# Patient Record
Sex: Male | Born: 1970 | Race: White | Hispanic: No | Marital: Married | Smoking: Never smoker
Health system: Southern US, Community
[De-identification: ages and names within clinical notes are randomized; demographics above are authoritative.]

## PROBLEM LIST (undated history)

## (undated) DIAGNOSIS — K219 Gastro-esophageal reflux disease without esophagitis: Secondary | ICD-10-CM

## (undated) DIAGNOSIS — Q600 Renal agenesis, unilateral: Secondary | ICD-10-CM

## (undated) DIAGNOSIS — N390 Urinary tract infection, site not specified: Secondary | ICD-10-CM

## (undated) DIAGNOSIS — N189 Chronic kidney disease, unspecified: Secondary | ICD-10-CM

## (undated) DIAGNOSIS — T7840XA Allergy, unspecified, initial encounter: Secondary | ICD-10-CM

## (undated) DIAGNOSIS — K2270 Barrett's esophagus without dysplasia: Secondary | ICD-10-CM

## (undated) DIAGNOSIS — B019 Varicella without complication: Secondary | ICD-10-CM

## (undated) HISTORY — DX: Allergy, unspecified, initial encounter: T78.40XA

## (undated) HISTORY — DX: Barrett's esophagus without dysplasia: K22.70

## (undated) HISTORY — DX: Varicella without complication: B01.9

## (undated) HISTORY — DX: Gastro-esophageal reflux disease without esophagitis: K21.9

## (undated) HISTORY — DX: Renal agenesis, unilateral: Q60.0

## (undated) HISTORY — DX: Urinary tract infection, site not specified: N39.0

## (undated) HISTORY — PX: FRENULECTOMY, LINGUAL: SHX1681

## (undated) HISTORY — DX: Chronic kidney disease, unspecified: N18.9

## (undated) HISTORY — PX: VASECTOMY: SHX75

---
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
## 2013-04-07 ENCOUNTER — Encounter: Payer: Self-pay | Admitting: Family Medicine

## 2013-04-07 ENCOUNTER — Ambulatory Visit (INDEPENDENT_AMBULATORY_CARE_PROVIDER_SITE_OTHER): Payer: No Typology Code available for payment source | Admitting: Family Medicine

## 2013-04-07 VITALS — BP 128/76 | HR 74 | Temp 98.2°F | Ht 72.0 in | Wt 213.0 lb

## 2013-04-07 DIAGNOSIS — H579 Unspecified disorder of eye and adnexa: Secondary | ICD-10-CM

## 2013-04-07 DIAGNOSIS — Z3009 Encounter for other general counseling and advice on contraception: Secondary | ICD-10-CM

## 2013-04-07 DIAGNOSIS — H53149 Visual discomfort, unspecified: Secondary | ICD-10-CM

## 2013-04-07 DIAGNOSIS — K644 Residual hemorrhoidal skin tags: Secondary | ICD-10-CM

## 2013-04-07 NOTE — Progress Notes (Signed)
Subjective:    Patient ID: Lucas Murray, male    DOB: Jan 08, 1971, 42 y.o.   MRN: [id number]  HPI Patient here to establish care. Generally very healthy. He has no real chronic medical problems. He has occasional GERD symptoms but has made some positive dietary changes over the past couple months and GERD has essentially resolved He had one episode only of UTI several years ago. Previous wisdom teeth extraction and frenulectomy age 32.  Patient is married has 2 children. Nonsmoker. 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 Only occasional alcohol use  He is requesting referral for vasectomy. He and his wife are sure they wish to have no further children. He had previous external hemorrhoids. He denies any recent bleeding or associated pain. Previously had some constipation issues but those have improved with recent dietary changes  Recently his had some left eye symptoms of what he initially described as" blurred vision". He went to ophthalmologist 2 months ago and they suggested reducing caffeine intake. He reportedly had a normal dilated eye exam then. He describes "jumping sensation" of left eye which is very transient -usually only lasting about 2-3 seconds with about 10-20 episodes per day. During these episodes, he has difficulty somewhat with focusing b/o the sensation of eye movement. No headaches. No fasciculations No diplopia. No recent appetite or weight changes.  Past Medical History  Diagnosis Date  . Chicken pox   . GERD (gastroesophageal reflux disease)   . Allergy   . Urinary tract infection    Past Surgical History  Procedure Laterality Date  . Wisdom tooth extraction  1990  . Frenulectomy, lingual N/A     age 32    reports that he has never smoked. He does not have any smokeless tobacco history on file. He reports that  drinks alcohol. He reports that he does not use illicit drugs. family history includes Arthritis in his maternal grandmother and mother;  Cancer in his maternal grandfather; Heart disease in his maternal grandfather; Hyperlipidemia in his mother; Hypertension in his maternal grandfather and mother. Allergies  Allergen Reactions  . E-Mycin [Erythromycin]   . Penicillins       Review of Systems  Constitutional: Negative for fever, appetite change and unexpected weight change.  Eyes: Positive for visual disturbance. Negative for photophobia, pain, discharge, redness and itching.  Respiratory: Negative for shortness of breath.   Cardiovascular: Negative for chest pain.  Gastrointestinal: Negative for nausea, vomiting, abdominal pain and constipation.  Genitourinary: Negative for dysuria.  Neurological: Negative for dizziness, seizures, weakness and headaches.       Objective:   Physical Exam  Constitutional: He is oriented to person, place, and time. He appears well-developed and well-nourished.  HENT:  Mouth/Throat: Oropharynx is clear and moist.  Eyes: EOM are normal. Pupils are equal, round, and reactive to light.  Pupils equal round reactive to light. Extraocular movements all normal. We had some difficulty seeing fundi bilaterally secondary to some glare  Cardiovascular: Normal rate and regular rhythm.   No murmur heard. Pulmonary/Chest: Effort normal and breath sounds normal. No respiratory distress. He has no wheezes. He has no rales.  Neurological: He is alert and oriented to person, place, and time. No cranial nerve deficit. Coordination normal.          Assessment & Plan:  #1 request for vasectomy. We'll set up a referral to urology #2 transient left eye symptoms above. Extraocular movements and basic eye exam normal. He is not describing typical fasciculations. Referral  back to ophthalmology. #3 external hemorrhoids. Currently stable. Continue high fiber diet

## 2013-04-07 NOTE — Patient Instructions (Addendum)
Follow up with ophthalmologist regarding left eye symptoms.   We will call you regarding urology appointment for vasectomy.

## 2014-12-30 ENCOUNTER — Other Ambulatory Visit (INDEPENDENT_AMBULATORY_CARE_PROVIDER_SITE_OTHER): Payer: PRIVATE HEALTH INSURANCE

## 2014-12-30 DIAGNOSIS — Z0000 Encounter for general adult medical examination without abnormal findings: Secondary | ICD-10-CM | POA: Diagnosis not present

## 2014-12-30 LAB — CBC WITH DIFFERENTIAL/PLATELET
Basophils Absolute: 0.1 10*3/uL (ref 0.0–0.1)
Basophils Relative: 1 % (ref 0–1)
EOS ABS: 0.2 10*3/uL (ref 0.0–0.7)
EOS PCT: 3 % (ref 0–5)
HCT: 42.6 % (ref 39.0–52.0)
Hemoglobin: 15.4 g/dL (ref 13.0–17.0)
LYMPHS ABS: 1.5 10*3/uL (ref 0.7–4.0)
Lymphocytes Relative: 24 % (ref 12–46)
MCH: 31 pg (ref 26.0–34.0)
MCHC: 36.2 g/dL — ABNORMAL HIGH (ref 30.0–36.0)
MCV: 85.9 fL (ref 78.0–100.0)
MONO ABS: 0.5 10*3/uL (ref 0.1–1.0)
MONOS PCT: 8 % (ref 3–12)
MPV: 10.1 fL (ref 8.6–12.4)
Neutro Abs: 4 10*3/uL (ref 1.7–7.7)
Neutrophils Relative %: 64 % (ref 43–77)
PLATELETS: 198 10*3/uL (ref 150–400)
RBC: 4.96 MIL/uL (ref 4.22–5.81)
RDW: 12.8 % (ref 11.5–15.5)
WBC: 6.2 10*3/uL (ref 4.0–10.5)

## 2014-12-30 NOTE — Addendum Note (Signed)
Addended by: Elmer Picker on: 12/30/2014 09:06 AM   Modules accepted: Orders

## 2014-12-31 LAB — LIPID PANEL
CHOL/HDL RATIO: 5.6 ratio
Cholesterol: 146 mg/dL (ref 0–200)
HDL: 26 mg/dL — AB (ref >=40)
LDL CALC: 89 mg/dL (ref 0–99)
TRIGLYCERIDES: 153 mg/dL — AB (ref <150)
VLDL: 31 mg/dL (ref 0–40)

## 2014-12-31 LAB — HEPATIC FUNCTION PANEL
ALBUMIN: 4.2 g/dL (ref 3.5–5.2)
ALK PHOS: 55 U/L (ref 39–117)
ALT: 28 U/L (ref 0–53)
AST: 21 U/L (ref 0–37)
BILIRUBIN TOTAL: 1.1 mg/dL (ref 0.2–1.2)
Bilirubin, Direct: 0.2 mg/dL (ref 0.0–0.3)
Indirect Bilirubin: 0.9 mg/dL (ref 0.2–1.2)
Total Protein: 7.1 g/dL (ref 6.0–8.3)

## 2014-12-31 LAB — BASIC METABOLIC PANEL
BUN: 12 mg/dL (ref 6–23)
CO2: 26 mEq/L (ref 19–32)
Calcium: 9.5 mg/dL (ref 8.4–10.5)
Chloride: 102 mEq/L (ref 96–112)
Creat: 1.15 mg/dL (ref 0.50–1.35)
GLUCOSE: 84 mg/dL (ref 70–99)
POTASSIUM: 4.2 meq/L (ref 3.5–5.3)
SODIUM: 137 meq/L (ref 135–145)

## 2014-12-31 LAB — TSH: TSH: 0.809 u[IU]/mL (ref 0.350–4.500)

## 2015-01-04 ENCOUNTER — Ambulatory Visit (INDEPENDENT_AMBULATORY_CARE_PROVIDER_SITE_OTHER): Payer: PRIVATE HEALTH INSURANCE | Admitting: Family Medicine

## 2015-01-04 ENCOUNTER — Encounter: Payer: Self-pay | Admitting: Family Medicine

## 2015-01-04 VITALS — BP 128/80 | HR 86 | Temp 97.9°F | Ht 71.65 in | Wt 216.0 lb

## 2015-01-04 DIAGNOSIS — Z23 Encounter for immunization: Secondary | ICD-10-CM | POA: Diagnosis not present

## 2015-01-04 DIAGNOSIS — Z0000 Encounter for general adult medical examination without abnormal findings: Secondary | ICD-10-CM | POA: Diagnosis not present

## 2015-01-04 NOTE — Patient Instructions (Signed)

## 2015-01-04 NOTE — Progress Notes (Signed)
   Subjective:    Patient ID: Lucas Murray, male    DOB: 03-19-1971, 44 y.o.   MRN: [id number]  HPI   Patient here for complete physical. He had vasectomy within the past year. 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 He has no chronic medical problems. No history of chronic kidney disease. Takes no regular medications. Nonsmoker. No consistent exercise. Is married with 2 children ages 34 and 69. Last tetanus over 10 years ago  Recent onset of cough about 2 weeks ago. Productive of mostly clear phlegm. No fevers. No dyspnea. No wheezing. No obvious GERD symptoms.  Past Medical History  Diagnosis Date  . Chicken pox   . GERD (gastroesophageal reflux disease)   . Allergy   . Urinary tract infection    Past Surgical History  Procedure Laterality Date  . Wisdom tooth extraction  1990  . Frenulectomy, lingual N/A     age 86    reports that he has never smoked. He does not have any smokeless tobacco history on file. He reports that he drinks alcohol. He reports that he does not use illicit drugs. family history includes Arthritis in his maternal grandmother and mother; Cancer in his maternal grandfather; Heart disease in his maternal grandfather; Hyperlipidemia in his mother; Hypertension in his maternal grandfather and mother. Allergies  Allergen Reactions  . E-Mycin [Erythromycin]   . Penicillins       Review of Systems  Constitutional: Negative for fever, activity change, appetite change and fatigue.  HENT: Negative for congestion, ear pain and trouble swallowing.   Eyes: Negative for pain and visual disturbance.  Respiratory: Negative for cough, shortness of breath and wheezing.   Cardiovascular: Negative for chest pain and palpitations.  Gastrointestinal: Negative for nausea, vomiting, abdominal pain, diarrhea, constipation, blood in stool, abdominal distention and  rectal pain.  Genitourinary: Negative for dysuria, hematuria and testicular pain.  Musculoskeletal: Negative for joint swelling and arthralgias.  Skin: Negative for rash.  Neurological: Negative for dizziness, syncope and headaches.  Hematological: Negative for adenopathy.  Psychiatric/Behavioral: Negative for confusion and dysphoric mood.       Objective:   Physical Exam  Constitutional: He is oriented to person, place, and time. He appears well-developed and well-nourished. No distress.  HENT:  Head: Normocephalic and atraumatic.  Right Ear: External ear normal.  Left Ear: External ear normal.  Mouth/Throat: Oropharynx is clear and moist.  Eyes: Conjunctivae and EOM are normal. Pupils are equal, round, and reactive to light.  Neck: Normal range of motion. Neck supple. No thyromegaly present.  Cardiovascular: Normal rate, regular rhythm and normal heart sounds.   No murmur heard. Pulmonary/Chest: No respiratory distress. He has no wheezes. He has no rales.  Abdominal: Soft. Bowel sounds are normal. He exhibits no distension and no mass. There is no tenderness. There is no rebound and no guarding.  Musculoskeletal: He exhibits no edema.  Lymphadenopathy:    He has no cervical adenopathy.  Neurological: He is alert and oriented to person, place, and time. He displays normal reflexes. No cranial nerve deficit.  Skin: No rash noted.  Psychiatric: He has a normal mood and affect.          Assessment & Plan:  Complete physical. Labs reviewed. He has low HDL and minimally elevated triglycerides. We've recommended more consistent exercise. Lose some weight.  Tetanus booster given. Handout on hypertriglyceridemia given

## 2015-01-04 NOTE — Progress Notes (Signed)
Pre visit review using our clinic review tool, if applicable. No additional management support is needed unless otherwise documented below in the visit note. 

## 2015-10-12 ENCOUNTER — Ambulatory Visit (INDEPENDENT_AMBULATORY_CARE_PROVIDER_SITE_OTHER): Payer: No Typology Code available for payment source | Admitting: Family Medicine

## 2015-10-12 ENCOUNTER — Encounter: Payer: Self-pay | Admitting: Family Medicine

## 2015-10-12 VITALS — BP 120/90 | HR 81 | Temp 98.2°F | Wt 216.8 lb

## 2015-10-12 DIAGNOSIS — B349 Viral infection, unspecified: Secondary | ICD-10-CM | POA: Diagnosis not present

## 2015-10-12 NOTE — Progress Notes (Signed)
Pre visit review using our clinic review tool, if applicable. No additional management support is needed unless otherwise documented below in the visit note. 

## 2015-10-12 NOTE — Patient Instructions (Signed)

## 2015-10-12 NOTE — Progress Notes (Signed)
   Subjective:    Patient ID: Lucas Murray, male    DOB: Dec 16, 1970, 45 y.o.   MRN: [id number]  HPI Acute visit. Patient developed onset of cough and probable fever last Friday. Did not take his temperature over the weekend but Monday had fever 101. He also noticed severe body aches. Denied any sore throat, nausea, vomiting. No fever since yesterday. Cough is been relatively mild and relieved with Mucinex DM. He had some decreased appetite. Did not receive flu vaccine this fall  Past Medical History  Diagnosis Date  . Chicken pox   . GERD (gastroesophageal reflux disease)   . Allergy   . Urinary tract infection    Past Surgical History  Procedure Laterality Date  . Wisdom tooth extraction  1990  . Frenulectomy, lingual N/A     age 70    reports that he has never smoked. He does not have any smokeless tobacco history on file. He reports that he drinks alcohol. He reports that he does not use illicit drugs. family history includes Arthritis in his maternal grandmother and mother; Cancer in his maternal grandfather; Heart disease in his maternal grandfather; Hyperlipidemia in his mother; Hypertension in his maternal grandfather and mother. Allergies  Allergen Reactions  . E-Mycin [Erythromycin]   . Penicillins       Review of Systems  Constitutional: Positive for fatigue. Negative for chills.  HENT: Positive for congestion. Negative for sore throat.   Respiratory: Positive for cough.        Objective:   Physical Exam  Constitutional: He appears well-developed and well-nourished.  HENT:  Right Ear: External ear normal.  Left Ear: External ear normal.  Mouth/Throat: Oropharynx is clear and moist.  Neck: Neck supple.  Cardiovascular: Normal rate and regular rhythm.   Pulmonary/Chest: Effort normal and breath sounds normal. No respiratory distress. He has no wheezes. He has no rales.  Lymphadenopathy:    He has no cervical adenopathy.  Skin: No rash noted.            Assessment & Plan:  Viral syndrome. Suspect he has had influenza. Given duration and the fact that his fevers resolved will not do flu testing at this time. Supportive care with continued increased fluids and Tylenol or Advil for body aches. Follow-up for any recurrent fever or other difficulties

## 2017-06-29 ENCOUNTER — Encounter (HOSPITAL_COMMUNITY): Payer: Self-pay | Admitting: Emergency Medicine

## 2017-06-29 ENCOUNTER — Inpatient Hospital Stay (HOSPITAL_COMMUNITY)
Admission: EM | Admit: 2017-06-29 | Discharge: 2017-07-08 | DRG: 330 | Disposition: A | Discharge status: Home / Self Care | Payer: No Typology Code available for payment source | Attending: Surgery | Admitting: Surgery

## 2017-06-29 ENCOUNTER — Inpatient Hospital Stay (HOSPITAL_COMMUNITY): Payer: No Typology Code available for payment source

## 2017-06-29 ENCOUNTER — Emergency Department (HOSPITAL_COMMUNITY): Payer: No Typology Code available for payment source

## 2017-06-29 DIAGNOSIS — Z8261 Family history of arthritis: Secondary | ICD-10-CM

## 2017-06-29 DIAGNOSIS — Y832 Surgical operation with anastomosis, bypass or graft as the cause of abnormal reaction of the patient, or of later complication, without mention of misadventure at the time of the procedure: Secondary | ICD-10-CM | POA: Diagnosis present

## 2017-06-29 DIAGNOSIS — K9189 Other postprocedural complications and disorders of digestive system: Secondary | ICD-10-CM | POA: Diagnosis present

## 2017-06-29 DIAGNOSIS — Z8249 Family history of ischemic heart disease and other diseases of the circulatory system: Secondary | ICD-10-CM

## 2017-06-29 DIAGNOSIS — K567 Ileus, unspecified: Secondary | ICD-10-CM | POA: Diagnosis present

## 2017-06-29 DIAGNOSIS — Z8349 Family history of other endocrine, nutritional and metabolic diseases: Secondary | ICD-10-CM | POA: Diagnosis not present

## 2017-06-29 DIAGNOSIS — K56609 Unspecified intestinal obstruction, unspecified as to partial versus complete obstruction: Secondary | ICD-10-CM | POA: Diagnosis present

## 2017-06-29 DIAGNOSIS — K56699 Other intestinal obstruction unspecified as to partial versus complete obstruction: Secondary | ICD-10-CM | POA: Diagnosis present

## 2017-06-29 DIAGNOSIS — R112 Nausea with vomiting, unspecified: Secondary | ICD-10-CM

## 2017-06-29 DIAGNOSIS — Z0189 Encounter for other specified special examinations: Secondary | ICD-10-CM

## 2017-06-29 DIAGNOSIS — R066 Hiccough: Secondary | ICD-10-CM

## 2017-06-29 LAB — COMPREHENSIVE METABOLIC PANEL
ALK PHOS: 59 U/L (ref 38–126)
ALT: 40 U/L (ref 17–63)
ANION GAP: 12 (ref 5–15)
AST: 41 U/L (ref 15–41)
Albumin: 5 g/dL (ref 3.5–5.0)
BILIRUBIN TOTAL: 1.6 mg/dL — AB (ref 0.3–1.2)
BUN: 14 mg/dL (ref 6–20)
CALCIUM: 10.4 mg/dL — AB (ref 8.9–10.3)
CO2: 21 mmol/L — ABNORMAL LOW (ref 22–32)
Chloride: 103 mmol/L (ref 101–111)
Creatinine, Ser: 1.16 mg/dL (ref 0.61–1.24)
GFR calc Af Amer: >60 mL/min (ref >60)
Glucose, Bld: 155 mg/dL — ABNORMAL HIGH (ref 65–99)
POTASSIUM: 4.9 mmol/L (ref 3.5–5.1)
Sodium: 136 mmol/L (ref 135–145)
TOTAL PROTEIN: 8.1 g/dL (ref 6.5–8.1)

## 2017-06-29 LAB — CBC
HEMATOCRIT: 47.9 % (ref 39.0–52.0)
Hemoglobin: 17.3 g/dL — ABNORMAL HIGH (ref 13.0–17.0)
MCH: 31.7 pg (ref 26.0–34.0)
MCHC: 36.1 g/dL — AB (ref 30.0–36.0)
MCV: 87.7 fL (ref 78.0–100.0)
PLATELETS: 260 10*3/uL (ref 150–400)
RBC: 5.46 MIL/uL (ref 4.22–5.81)
RDW: 12.4 % (ref 11.5–15.5)
WBC: 15.7 10*3/uL — AB (ref 4.0–10.5)

## 2017-06-29 LAB — URINALYSIS, ROUTINE W REFLEX MICROSCOPIC
BACTERIA UA: NONE SEEN
Bilirubin Urine: NEGATIVE
Glucose, UA: NEGATIVE mg/dL
HGB URINE DIPSTICK: NEGATIVE
Ketones, ur: 20 mg/dL — AB
Leukocytes, UA: NEGATIVE
Nitrite: NEGATIVE
PROTEIN: 30 mg/dL — AB
SQUAMOUS EPITHELIAL / LPF: NONE SEEN
Specific Gravity, Urine: 1.028 (ref 1.005–1.030)
pH: 6 (ref 5.0–8.0)

## 2017-06-29 LAB — I-STAT CG4 LACTIC ACID, ED
LACTIC ACID, VENOUS: 1.99 mmol/L — AB (ref 0.5–1.9)
LACTIC ACID, VENOUS: 1.3 mmol/L (ref 0.5–1.9)

## 2017-06-29 LAB — LIPASE, BLOOD: Lipase: 33 U/L (ref 11–51)

## 2017-06-29 MED ORDER — LIDOCAINE HCL 2 % EX GEL
1.0000 "application " | Freq: Once | CUTANEOUS | Status: DC | PRN
  Filled 2017-06-29: qty 5

## 2017-06-29 MED ORDER — DIPHENHYDRAMINE HCL 25 MG PO CAPS: 25.0000 mg | ORAL_CAPSULE | Freq: Four times a day (QID) | ORAL | Status: DC | PRN

## 2017-06-29 MED ORDER — IOPAMIDOL (ISOVUE-300) INJECTION 61%
INTRAVENOUS | Status: AC
  Administered 2017-06-29: 80 mL via INTRAVENOUS
  Filled 2017-06-29: qty 100

## 2017-06-29 MED ORDER — ONDANSETRON 4 MG PO TBDP: 4.0000 mg | ORAL_TABLET | Freq: Four times a day (QID) | ORAL | Status: DC | PRN

## 2017-06-29 MED ORDER — DIATRIZOATE MEGLUMINE & SODIUM 66-10 % PO SOLN
90.0000 mL | Freq: Once | ORAL | Status: AC
  Administered 2017-06-30: 90 mL via NASOGASTRIC
  Filled 2017-06-29: qty 90

## 2017-06-29 MED ORDER — ONDANSETRON HCL 4 MG/2ML IJ SOLN
4.0000 mg | Freq: Once | INTRAMUSCULAR | Status: AC
  Administered 2017-06-29: 4 mg via INTRAVENOUS
  Filled 2017-06-29: qty 2

## 2017-06-29 MED ORDER — POTASSIUM CHLORIDE IN NACL 20-0.9 MEQ/L-% IV SOLN
INTRAVENOUS | Status: DC
  Administered 2017-06-29 – 2017-07-01 (×4): via INTRAVENOUS
  Filled 2017-06-29 (×5): qty 1000

## 2017-06-29 MED ORDER — DIPHENHYDRAMINE HCL 50 MG/ML IJ SOLN: 25.0000 mg | Freq: Four times a day (QID) | INTRAMUSCULAR | Status: DC | PRN

## 2017-06-29 MED ORDER — ONDANSETRON HCL 4 MG/2ML IJ SOLN
4.0000 mg | Freq: Once | INTRAMUSCULAR | Status: AC | PRN
  Administered 2017-06-29: 4 mg via INTRAVENOUS
  Filled 2017-06-29: qty 2

## 2017-06-29 MED ORDER — METOCLOPRAMIDE HCL 5 MG/ML IJ SOLN
10.0000 mg | Freq: Once | INTRAMUSCULAR | Status: AC
  Administered 2017-06-29: 10 mg via INTRAVENOUS
  Filled 2017-06-29: qty 2

## 2017-06-29 MED ORDER — IOPAMIDOL (ISOVUE-300) INJECTION 61%: 30.0000 mL | Freq: Once | INTRAVENOUS | Status: DC

## 2017-06-29 MED ORDER — ONDANSETRON HCL 4 MG/2ML IJ SOLN
4.0000 mg | Freq: Four times a day (QID) | INTRAMUSCULAR | Status: DC | PRN
  Administered 2017-07-01: 4 mg via INTRAVENOUS
  Filled 2017-06-29: qty 2

## 2017-06-29 MED ORDER — ZOLPIDEM TARTRATE 5 MG PO TABS: 5.0000 mg | ORAL_TABLET | Freq: Every evening | ORAL | Status: DC | PRN

## 2017-06-29 MED ORDER — IOPAMIDOL (ISOVUE-300) INJECTION 61%
INTRAVENOUS | Status: AC
  Administered 2017-06-29: 30 mL via ORAL
  Filled 2017-06-29: qty 30

## 2017-06-29 MED ORDER — PANTOPRAZOLE SODIUM 40 MG IV SOLR
40.0000 mg | Freq: Every day | INTRAVENOUS | Status: DC
  Administered 2017-06-29 – 2017-07-07 (×9): 40 mg via INTRAVENOUS
  Filled 2017-06-29 (×9): qty 40

## 2017-06-29 MED ORDER — METOCLOPRAMIDE HCL 5 MG/ML IJ SOLN
10.0000 mg | INTRAMUSCULAR | Status: AC
  Administered 2017-06-29: 10 mg via INTRAVENOUS
  Filled 2017-06-29: qty 2

## 2017-06-29 MED ORDER — MORPHINE SULFATE (PF) 2 MG/ML IV SOLN
2.0000 mg | INTRAVENOUS | Status: DC | PRN
  Administered 2017-06-29 – 2017-07-01 (×3): 2 mg via INTRAVENOUS
  Filled 2017-06-29 (×3): qty 1

## 2017-06-29 MED ORDER — FENTANYL CITRATE (PF) 100 MCG/2ML IJ SOLN
50.0000 ug | Freq: Once | INTRAMUSCULAR | Status: AC
  Administered 2017-06-29: 50 ug via INTRAVENOUS
  Filled 2017-06-29: qty 2

## 2017-06-29 MED ORDER — SODIUM CHLORIDE 0.9 % IV BOLUS (SEPSIS)
1000.0000 mL | Freq: Once | INTRAVENOUS | Status: AC
  Administered 2017-06-29: 1000 mL via INTRAVENOUS

## 2017-06-29 MED ORDER — PROCHLORPERAZINE EDISYLATE 5 MG/ML IJ SOLN
5.0000 mg | Freq: Four times a day (QID) | INTRAMUSCULAR | Status: DC | PRN
  Administered 2017-06-30: 10 mg via INTRAVENOUS
  Filled 2017-06-29: qty 2

## 2017-06-29 MED ORDER — SODIUM CHLORIDE 0.9 % IV BOLUS (SEPSIS)
1000.0000 mL | Freq: Once | INTRAVENOUS | Status: AC
Start: 2017-06-29 — End: 2017-06-29
  Administered 2017-06-29: 1000 mL via INTRAVENOUS

## 2017-06-29 MED ORDER — ENOXAPARIN SODIUM 40 MG/0.4ML ~~LOC~~ SOLN
40.0000 mg | Freq: Every day | SUBCUTANEOUS | Status: DC
  Administered 2017-06-30 – 2017-07-07 (×8): 40 mg via SUBCUTANEOUS
  Filled 2017-06-29 (×8): qty 0.4

## 2017-06-29 MED ORDER — PROCHLORPERAZINE MALEATE 10 MG PO TABS: 10.0000 mg | ORAL_TABLET | Freq: Four times a day (QID) | ORAL | Status: DC | PRN

## 2017-06-29 NOTE — ED Notes (Signed)
Only able to get enough blood for light green top tube.

## 2017-06-29 NOTE — ED Notes (Signed)
Pt transported to CT ?

## 2017-06-29 NOTE — H&P (Signed)
Lucas Murray is an 46 y.o. male.   Chief Complaint: Abdominal pain, nausea, vomiting HPI: This is a healthy 46 yo male with no previous surgical history who presents with 24 hours of mid-periumbilical abdominal pain, distention, and multiple episodes of nausea and vomiting.  He had a bowel movements yesterday about lunchtime which was normal in consistency, perhaps darker in color.  He still feels distended, despite multiple episodes of emesis.  He presented to the ED for evaluation and was found to have an SBO, despite no previous surgeries.  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  Past Medical History:  Diagnosis Date  . Allergy   . Chicken pox   . GERD (gastroesophageal reflux disease)   . Urinary tract infection     Past Surgical History:  Procedure Laterality Date  . FRENULECTOMY, LINGUAL N/A    age 20  . VASECTOMY    . WISDOM TOOTH EXTRACTION  1990    Family History  Problem Relation Age of Onset  . Arthritis Mother   . Hyperlipidemia Mother   . Hypertension Mother   . Arthritis Maternal Grandmother   . Heart disease Maternal Grandfather   . Cancer Maternal Grandfather        lung  . Hypertension Maternal Grandfather    Social History:  reports that  has never smoked. he has never used smokeless tobacco. He reports that he drinks alcohol. He reports that he does not use drugs.  Allergies:  Allergies  Allergen Reactions  . E-Mycin [Erythromycin] Anaphylaxis  . Penicillins     Has patient had a PCN reaction causing immediate rash, facial/tongue/throat swelling, SOB or lightheadedness with hypotension: No Has patient had a PCN reaction causing severe rash involving mucus membranes or skin necrosis: No Has patient had a PCN reaction that required hospitalization: No Has patient had a PCN reaction occurring within the last 10 years: No If all of the above answers are "NO", then may proceed with Cephalosporin use.    Meds:  none  Results for orders placed or performed during  the hospital encounter of 06/29/17 (from the past 48 hour(s))  Lipase, blood     Status: None   Collection Time: 06/29/17  2:56 PM  Result Value Ref Range   Lipase 33 11 - 51 U/L  Comprehensive metabolic panel     Status: Abnormal   Collection Time: 06/29/17  2:56 PM  Result Value Ref Range   Sodium 136 135 - 145 mmol/L   Potassium 4.9 3.5 - 5.1 mmol/L   Chloride 103 101 - 111 mmol/L   CO2 21 (L) 22 - 32 mmol/L   Glucose, Bld 155 (H) 65 - 99 mg/dL   BUN 14 6 - 20 mg/dL   Creatinine, Ser 1.16 0.61 - 1.24 mg/dL   Calcium 10.4 (H) 8.9 - 10.3 mg/dL   Total Protein 8.1 6.5 - 8.1 g/dL   Albumin 5.0 3.5 - 5.0 g/dL   AST 41 15 - 41 U/L   ALT 40 17 - 63 U/L   Alkaline Phosphatase 59 38 - 126 U/L   Total Bilirubin 1.6 (H) 0.3 - 1.2 mg/dL   GFR calc non Af Amer >60 >60 mL/min   GFR calc Af Amer >60 >60 mL/min    Comment: (NOTE) The eGFR has been calculated using the CKD EPI equation. This calculation has not been validated in all clinical situations. eGFR's persistently <60 mL/min signify possible Chronic Kidney Disease.    Anion gap 12 5 - 15  CBC     Status: Abnormal   Collection Time: 06/29/17  2:56 PM  Result Value Ref Range   WBC 15.7 (H) 4.0 - 10.5 K/uL   RBC 5.46 4.22 - 5.81 MIL/uL   Hemoglobin 17.3 (H) 13.0 - 17.0 g/dL   HCT 47.9 39.0 - 52.0 %   MCV 87.7 78.0 - 100.0 fL   MCH 31.7 26.0 - 34.0 pg   MCHC 36.1 (H) 30.0 - 36.0 g/dL   RDW 12.4 11.5 - 15.5 %   Platelets 260 150 - 400 K/uL  Urinalysis, Routine w reflex microscopic     Status: Abnormal   Collection Time: 06/29/17  3:17 PM  Result Value Ref Range   Color, Urine YELLOW YELLOW   APPearance CLEAR CLEAR   Specific Gravity, Urine 1.028 1.005 - 1.030   pH 6.0 5.0 - 8.0   Glucose, UA NEGATIVE NEGATIVE mg/dL   Hgb urine dipstick NEGATIVE NEGATIVE   Bilirubin Urine NEGATIVE NEGATIVE   Ketones, ur 20 (A) NEGATIVE mg/dL   Protein, ur 30 (A) NEGATIVE mg/dL   Nitrite NEGATIVE NEGATIVE   Leukocytes, UA NEGATIVE NEGATIVE    RBC / HPF 0-5 0 - 5 RBC/hpf   WBC, UA 0-5 0 - 5 WBC/hpf   Bacteria, UA NONE SEEN NONE SEEN   Squamous Epithelial / LPF NONE SEEN NONE SEEN   Mucus PRESENT   I-Stat CG4 Lactic Acid, ED     Status: Abnormal   Collection Time: 06/29/17  3:32 PM  Result Value Ref Range   Lactic Acid, Venous 1.99 (H) 0.5 - 1.9 mmol/L  I-Stat CG4 Lactic Acid, ED     Status: None   Collection Time: 06/29/17  5:31 PM  Result Value Ref Range   Lactic Acid, Venous 1.30 0.5 - 1.9 mmol/L   Ct Abdomen Pelvis W Contrast  Result Date: 06/29/2017 CLINICAL DATA:  46 year old male with abdominal pain. Concern for gastroenteritis or colitis. EXAM: CT ABDOMEN AND PELVIS WITH CONTRAST TECHNIQUE: Multidetector CT imaging of the abdomen and pelvis was performed using the standard protocol following bolus administration of intravenous contrast. CONTRAST:  76m ISOVUE-300 IOPAMIDOL (ISOVUE-300) INJECTION 61% COMPARISON:  None. FINDINGS: Lower chest: There are areas of bibasilar atelectasis/scarring. There is no intra-abdominal free air.  Small ascites. Hepatobiliary: Probable mild fatty infiltration of the liver. No intrahepatic biliary ductal dilatation. The gallbladder is unremarkable. Pancreas: The pancreas is slightly atrophic for the patient's age. No inflammatory changes. There is no dilatation of the main pancreatic duct. Spleen: Normal in size without focal abnormality. Adrenals/Urinary Tract: The adrenal glands are unremarkable. There is a solitary left kidney which appears unremarkable. The visualized left ureter and urinary bladder are grossly unremarkable. Stomach/Bowel: There is thickened appearance of the terminal ileum which may be related to underdistention or represent mild inflammation. There is a focal area of high grade stricture in the distal/terminal ileum in the right hemi pelvis (series 2, image 72 and coronal series 4 image 96) with associated obstruction. There is tethering of the transition point to the  adjacent small bowel loop suggestive of underlying adhesion/scarring. There is fecalization of the distended small bowel proximal to this point as well as diffuse dilatation of the fluid-filled loops of small bowel more proximally. There is stool within the colon likely representing passage of content prior to the high-grade obstruction an suggestive of recent or gradual development of this degree of obstruction. The stomach is distended with oral contrast. There is a small hiatal hernia with gastroesophageal reflux. The  visualized appendix appears unremarkable. Vascular/Lymphatic: The abdominal aorta and IVC appear unremarkable. No portal venous gas identified. 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 There is no adenopathy. Reproductive: The prostate gland is grossly unremarkable. 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 The left seminal vesicle is unremarkable. Other: Small fat containing umbilical hernia. Musculoskeletal: No acute or significant osseous findings. IMPRESSION: 1. High-grade small bowel obstruction with transition zone in the distal/terminal in the right hemipelvis likely related to adhesion/stricture. Underlying mass is not entirely excluded. Clinical correlation and surgical consult is advised. There is mild thickened appearance of the terminal ileum likely representing some inflammatory changes. 2. 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 3. 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 Follow-up in 1 year recommended. Further evaluation with angiography recommended. Electronically Signed   By: Anner Crete M.D.   On: 06/29/2017 20:05    Review of Systems  Constitutional: Negative for weight loss.  HENT: Negative for ear discharge, ear pain, hearing loss and tinnitus.   Eyes: Negative for blurred vision, double vision, photophobia and pain.  Respiratory: Negative for cough, sputum production and shortness  of breath.   Cardiovascular: Negative for chest pain.  Gastrointestinal: Positive for abdominal pain, nausea and vomiting.       Abdominal distention  Genitourinary: Negative for dysuria, flank pain, frequency and urgency.  Musculoskeletal: Negative for back pain, falls, joint pain, myalgias and neck pain.  Neurological: Negative for dizziness, tingling, sensory change, focal weakness, loss of consciousness and headaches.  Endo/Heme/Allergies: Does not bruise/bleed easily.  Psychiatric/Behavioral: Negative for depression, memory loss and substance abuse. The patient is not nervous/anxious.     Blood pressure (!) 154/107, pulse 93, temperature (!) 97.5 F (36.4 C), temperature source Oral, resp. rate 19, height 6' (1.829 m), weight 97.5 kg (215 lb), SpO2 99 %. Physical Exam  WDWN in NAD Eyes:  Pupils equal, round; sclera anicteric HENT:  Oral mucosa moist; good dentition  Neck:  No masses palpated, no thyromegaly Lungs:  CTA bilaterally; normal respiratory effort CV:  Regular rate and rhythm; no murmurs; extremities well-perfused with no edema Abd:  +bowel sounds, distended but not tense; no peritonitis; tender in mid-abdomen in periumbilical region Skin:  Warm, dry; no sign of jaundice Psychiatric - alert and oriented x 4; calm mood and affect  Assessment/Plan Small bowel obstruction of unclear etiology with transition zone in distal ileum in right pelvis.  Slightly thickening of the terminal ileum.  Admit for IV hydration/ bowel decompression NG tube to [facility] SBO protocol  Maia Petties, MD 06/29/2017, 8:59 PM

## 2017-06-29 NOTE — ED Provider Notes (Signed)
[facility] DEPT Provider Note   CSN: [id number] Arrival date & time: 06/29/17  1415     History   Chief Complaint Chief Complaint  Patient presents with  . Abdominal Pain  . Emesis    HPI Asheton Scheffler is a 46 y.o. male.  46yo M w/ h/o GERD, solitary kidney who p/w vomiting and abdominal pain.  Last night the patient began having central generalized abdominal pain that has been mild but constant with occasional waves of intensity.  The pain is nonradiating.  He began vomiting last night and has had persistent vomiting today, unable to tolerate any liquids.  He states that the vomiting has been dark brown and wife has noted that it does appear coffee grounds.  When asked about blood in his stools, he states that his stools may have been a little darker recently but he denies any diarrhea or constipation.  He has never had problem with GI bleeding before, denies any anticoagulant use, denies any history of heavy NSAID or alcohol use.  He felt hot and had subjective fever today.  Today his urine has appeared dark and he has urinated last, mild burning with urination.  No cough, nasal congestion, sore throat, sick contacts, or recent travel.  No medications prior to arrival.   The history is provided by the patient.  Abdominal Pain   Associated symptoms include vomiting.  Emesis   Associated symptoms include abdominal pain.    Past Medical History:  Diagnosis Date  . Allergy   . Chicken pox   . GERD (gastroesophageal reflux disease)   . Urinary tract infection     There are no active problems to display for this patient.   Past Surgical History:  Procedure Laterality Date  . FRENULECTOMY, LINGUAL N/A    age 38  . VASECTOMY    . [place] Medications    Prior to Admission medications   Medication Sig Start Date End Date Taking? Authorizing Provider  acetaminophen (TYLENOL) 500 MG tablet Take 1,000 mg every 6  (six) hours as needed by mouth for mild pain or headache.   Yes [provider]  naproxen sodium (ALEVE) 220 MG tablet Take 220 mg daily as needed by mouth (back pain).   Yes [provider]    Family History Family History  Problem Relation Age of Onset  . Arthritis Mother   . Hyperlipidemia Mother   . Hypertension Mother   . Arthritis Maternal Grandmother   . Heart disease Maternal Grandfather   . Cancer Maternal Grandfather        lung  . Hypertension Maternal Grandfather     Social History Social History   Tobacco Use  . Smoking status: Never Smoker  . Smokeless tobacco: Never Used  Substance Use Topics  . Alcohol use: Yes    Comment: occasional   . Drug use: No     Allergies   E-mycin [erythromycin] and Penicillins   Review of Systems Review of Systems  Gastrointestinal: Positive for abdominal pain and vomiting.   All other systems reviewed and are negative except that which was mentioned in HPI   Physical Exam Updated Vital Signs BP (!) 154/107 (BP Location: Left Arm)   Pulse 93   Temp (!) 97.5 F (36.4 C) (Oral)   Resp 19   Ht 6' (1.829 m)   Wt 97.5 kg (215 lb)   SpO2 99%   BMI 29.16  kg/m   Physical Exam  Constitutional: He is oriented to person, place, and time. He appears well-developed and well-nourished.  Non-toxic appearance. He appears ill. No distress.  HENT:  Head: Normocephalic and atraumatic.  Dry mouth  Eyes: Conjunctivae are normal. Pupils are equal, round, and reactive to light.  Neck: Neck supple.  Cardiovascular: Normal rate, regular rhythm and normal heart sounds.  No murmur heard. Pulmonary/Chest: Effort normal and breath sounds normal.  Abdominal: Soft. Normal appearance and bowel sounds are normal. He exhibits no distension. There is generalized tenderness. There is no rigidity, no rebound, no guarding and negative Murphy's sign.  Musculoskeletal: He exhibits no edema.  Neurological: He is alert and oriented  to person, place, and time.  Fluent speech  Skin: Skin is warm and dry.  Psychiatric: He has a normal mood and affect. Judgment normal.  Nursing note and vitals reviewed.    ED Treatments / Results  Labs (all labs ordered are listed, but only abnormal results are displayed) Labs Reviewed  COMPREHENSIVE METABOLIC PANEL - Abnormal; Notable for the following components:      Result Value   CO2 21 (*)    Glucose, Bld 155 (*)    Calcium 10.4 (*)    Total Bilirubin 1.6 (*)    All other components within normal limits  CBC - Abnormal; Notable for the following components:   WBC 15.7 (*)    Hemoglobin 17.3 (*)    MCHC 36.1 (*)    All other components within normal limits  URINALYSIS, ROUTINE W REFLEX MICROSCOPIC - Abnormal; Notable for the following components:   Ketones, ur 20 (*)    Protein, ur 30 (*)    All other components within normal limits  I-STAT CG4 LACTIC ACID, ED - Abnormal; Notable for the following components:   Lactic Acid, Venous 1.99 (*)    All other components within normal limits  LIPASE, BLOOD  I-STAT CG4 LACTIC ACID, ED    EKG  EKG Interpretation None       Radiology Ct Abdomen Pelvis W Contrast  Result Date: 06/29/2017 CLINICAL DATA:  46 year old male with abdominal pain. Concern for gastroenteritis or colitis. EXAM: CT ABDOMEN AND PELVIS WITH CONTRAST TECHNIQUE: Multidetector CT imaging of the abdomen and pelvis was performed using the standard protocol following bolus administration of intravenous contrast. CONTRAST:  10mL ISOVUE-300 IOPAMIDOL (ISOVUE-300) INJECTION 61% COMPARISON:  None. FINDINGS: Lower chest: There are areas of bibasilar atelectasis/scarring. There is no intra-abdominal free air.  Small ascites. Hepatobiliary: Probable mild fatty infiltration of the liver. No intrahepatic biliary ductal dilatation. The gallbladder is unremarkable. Pancreas: The pancreas is slightly atrophic for the patient's age. No inflammatory changes. There is no  dilatation of the main pancreatic duct. Spleen: Normal in size without focal abnormality. Adrenals/Urinary Tract: The adrenal glands are unremarkable. There is a solitary left kidney which appears unremarkable. The visualized left ureter and urinary bladder are grossly unremarkable. Stomach/Bowel: There is thickened appearance of the terminal ileum which may be related to underdistention or represent mild inflammation. There is a focal area of high grade stricture in the distal/terminal ileum in the right hemi pelvis (series 2, image 72 and coronal series 4 image 96) with associated obstruction. There is tethering of the transition point to the adjacent small bowel loop suggestive of underlying adhesion/scarring. There is fecalization of the distended small bowel proximal to this point as well as diffuse dilatation of the fluid-filled loops of small bowel more proximally. There is stool within the colon  likely representing passage of content prior to the high-grade obstruction an suggestive of recent or gradual development of this degree of obstruction. The stomach is distended with oral contrast. There is a small hiatal hernia with gastroesophageal reflux. The visualized appendix appears unremarkable. Vascular/Lymphatic: The abdominal aorta and IVC appear unremarkable. No portal venous gas identified. 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 There is no adenopathy. Reproductive: The prostate gland is grossly unremarkable. 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 The left seminal vesicle is unremarkable. Other: Small fat containing umbilical hernia. Musculoskeletal: No acute or significant osseous findings. IMPRESSION: 1. High-grade small bowel obstruction with transition zone in the distal/terminal in the right hemipelvis likely related to adhesion/stricture. Underlying mass is not entirely excluded. Clinical correlation and surgical consult is advised.  There is mild thickened appearance of the terminal ileum likely representing some inflammatory changes. 2. 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 3. 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 Follow-up in 1 year recommended. Further evaluation with angiography recommended. Electronically Signed   By: Anner Crete M.D.   On: 06/29/2017 20:05    Procedures Procedures (including critical care time)  Medications Ordered in ED Medications  iopamidol (ISOVUE-300) 61 % injection 30 mL (not administered)  metoCLOPramide (REGLAN) injection 10 mg (not administered)  ondansetron (ZOFRAN) injection 4 mg (4 mg Intravenous Given 06/29/17 1501)  sodium chloride 0.9 % bolus 1,000 mL (0 mLs Intravenous Stopped 06/29/17 1620)  ondansetron (ZOFRAN) injection 4 mg (4 mg Intravenous Given 06/29/17 1541)  sodium chloride 0.9 % bolus 1,000 mL (1,000 mLs Intravenous New Bag/Given 06/29/17 1741)  fentaNYL (SUBLIMAZE) injection 50 mcg (50 mcg Intravenous Given 06/29/17 1741)  iopamidol (ISOVUE-300) 61 % injection (30 mLs Oral Contrast Given 06/29/17 1735)  iopamidol (ISOVUE-300) 61 % injection (80 mLs Intravenous Contrast Given 06/29/17 1925)  metoCLOPramide (REGLAN) injection 10 mg (10 mg Intravenous Given 06/29/17 1840)     Initial Impression / Assessment and Plan / ED Course  I have reviewed the triage vital signs and the nursing notes.  Pertinent labs & imaging results that were available during my care of the patient were reviewed by me and considered in my medical decision making (see chart for details).     1d of generalized abdominal pain with vomiting, questionable coffee grounds emesis.  Vital signs stable on arrival.  Generalized tenderness without peritonitis.  DDx includes upper GI bleed, gastroenteritis, diverticulitis, SBO although less likely given no abd surgeries. Gave IVF, zofran and obtained above labs were notable for lactate 1.99, normal creatinine, WBC 15.7.   CT scan shows high-grade small bowel obstruction with transition at distal/terminal ileum likely related to adhesion.  I discussed these findings with radiology and also with general surgery, Dr. Georgette Dover. He will admit patient for further management.  Final Clinical Impressions(s) / ED Diagnoses   Final diagnoses:  Small bowel obstruction (HCC)  Nausea and vomiting, intractability of vomiting not specified, unspecified vomiting type  Hiccups    ED Discharge Orders    None       Little, Wenda Overland, MD 06/29/17 2041

## 2017-06-29 NOTE — ED Triage Notes (Signed)
Patient c/o mid abd pain that started last night with vomiting. Patient denies diarrhea, constipation. Patient reports that he has one kidney and urine has been dark and small amount since vomiting started.

## 2017-06-30 ENCOUNTER — Other Ambulatory Visit: Payer: Self-pay

## 2017-06-30 ENCOUNTER — Encounter (HOSPITAL_COMMUNITY): Payer: Self-pay

## 2017-06-30 ENCOUNTER — Inpatient Hospital Stay (HOSPITAL_COMMUNITY): Payer: No Typology Code available for payment source

## 2017-06-30 LAB — BASIC METABOLIC PANEL
Anion gap: 8 (ref 5–15)
BUN: 17 mg/dL (ref 6–20)
CHLORIDE: 105 mmol/L (ref 101–111)
CO2: 25 mmol/L (ref 22–32)
Calcium: 9.3 mg/dL (ref 8.9–10.3)
Creatinine, Ser: 1.04 mg/dL (ref 0.61–1.24)
Glucose, Bld: 178 mg/dL — ABNORMAL HIGH (ref 65–99)
POTASSIUM: 4.1 mmol/L (ref 3.5–5.1)
SODIUM: 138 mmol/L (ref 135–145)

## 2017-06-30 LAB — CBC
HEMATOCRIT: 46.7 % (ref 39.0–52.0)
Hemoglobin: 16.4 g/dL (ref 13.0–17.0)
MCH: 31.2 pg (ref 26.0–34.0)
MCHC: 35.1 g/dL (ref 30.0–36.0)
MCV: 89 fL (ref 78.0–100.0)
PLATELETS: 263 10*3/uL (ref 150–400)
RBC: 5.25 MIL/uL (ref 4.22–5.81)
RDW: 12.7 % (ref 11.5–15.5)
WBC: 16.4 10*3/uL — AB (ref 4.0–10.5)

## 2017-06-30 LAB — HIV ANTIBODY (ROUTINE TESTING W REFLEX): HIV SCREEN 4TH GENERATION: NONREACTIVE

## 2017-06-30 MED ORDER — PHENOL 1.4 % MT LIQD
1.0000 | OROMUCOSAL | Status: DC | PRN
  Filled 2017-06-30: qty 177

## 2017-06-30 NOTE — Progress Notes (Signed)
Subjective/Chief Complaint: Feels better than PTA No flatus yet About 500 cc NG output Feels like abdomen is softer, non-tender   Objective: Vital signs in last 24 hours: Temp:  [97.5 F (36.4 C)-98.7 F (37.1 C)] 98.2 F (36.8 C) (11/18 0505) Pulse Rate:  [87-106] 87 (11/18 0505) Resp:  [17-19] 18 (11/18 0505) BP: (124-154)/(71-107) 134/90 (11/18 0505) SpO2:  [93 %-100 %] 95 % (11/18 0505) Weight:  [97.5 kg (215 lb)-118.4 kg (261 lb 0.4 oz)] 118.4 kg (261 lb 0.4 oz) (11/17 2157)    Intake/Output from previous day: 11/17 0701 - 11/18 0700 In: 1400 [I.V.:400; IV Piggyback:1000] Out: 650 [Urine:400; Emesis/NG output:250] Intake/Output this shift: No intake/output data recorded.  General appearance: alert, cooperative and no distress Resp: clear to auscultation bilaterally Cardio: regular rate and rhythm, S1, S2 normal, no murmur, click, rub or gallop GI: mildly distended; some bowel sounds; non-tender  Lab Results:  Recent Labs    06/29/17 1456 06/30/17 0404  WBC 15.7* 16.4*  HGB 17.3* 16.4  HCT 47.9 46.7  PLT 260 263   BMET Recent Labs    06/29/17 1456 06/30/17 0404  NA 136 138  K 4.9 4.1  CL 103 105  CO2 21* 25  GLUCOSE 155* 178*  BUN 14 17  CREATININE 1.16 1.04  CALCIUM 10.4* 9.3   PT/INR No results for input(s): LABPROT, INR in the last 72 hours. ABG No results for input(s): PHART, HCO3 in the last 72 hours.  Invalid input(s): PCO2, PO2  Studies/Results: Ct Abdomen Pelvis W Contrast  Result Date: 06/29/2017 CLINICAL DATA:  46 year old male with abdominal pain. Concern for gastroenteritis or colitis. EXAM: CT ABDOMEN AND PELVIS WITH CONTRAST TECHNIQUE: Multidetector CT imaging of the abdomen and pelvis was performed using the standard protocol following bolus administration of intravenous contrast. CONTRAST:  63mL ISOVUE-300 IOPAMIDOL (ISOVUE-300) INJECTION 61% COMPARISON:  None. FINDINGS: Lower chest: There are areas of bibasilar  atelectasis/scarring. There is no intra-abdominal free air.  Small ascites. Hepatobiliary: Probable mild fatty infiltration of the liver. No intrahepatic biliary ductal dilatation. The gallbladder is unremarkable. Pancreas: The pancreas is slightly atrophic for the patient's age. No inflammatory changes. There is no dilatation of the main pancreatic duct. Spleen: Normal in size without focal abnormality. Adrenals/Urinary Tract: The adrenal glands are unremarkable. There is a solitary left kidney which appears unremarkable. The visualized left ureter and urinary bladder are grossly unremarkable. Stomach/Bowel: There is thickened appearance of the terminal ileum which may be related to underdistention or represent mild inflammation. There is a focal area of high grade stricture in the distal/terminal ileum in the right hemi pelvis (series 2, image 72 and coronal series 4 image 96) with associated obstruction. There is tethering of the transition point to the adjacent small bowel loop suggestive of underlying adhesion/scarring. There is fecalization of the distended small bowel proximal to this point as well as diffuse dilatation of the fluid-filled loops of small bowel more proximally. There is stool within the colon likely representing passage of content prior to the high-grade obstruction an suggestive of recent or gradual development of this degree of obstruction. The stomach is distended with oral contrast. There is a small hiatal hernia with gastroesophageal reflux. The visualized appendix appears unremarkable. Vascular/Lymphatic: The abdominal aorta and IVC appear unremarkable. No portal venous gas identified. 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 There is no adenopathy. Reproductive: The prostate gland is grossly unremarkable. There is congenital absence of the right  seminal vesicle. The left seminal vesicle is unremarkable. Other: Small fat containing umbilical  hernia. Musculoskeletal: No acute or significant osseous findings. IMPRESSION: 1. High-grade small bowel obstruction with transition zone in the distal/terminal in the right hemipelvis likely related to adhesion/stricture. Underlying mass is not entirely excluded. Clinical correlation and surgical consult is advised. There is mild thickened appearance of the terminal ileum likely representing some inflammatory changes. 2. 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 3. 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 Follow-up in 1 year recommended. Further evaluation with angiography recommended. Electronically Signed   By: Anner Crete M.D.   On: 06/29/2017 20:05   Dg Abd Portable 1v-small Bowel Protocol-position Verification  Result Date: 06/29/2017 CLINICAL DATA:  Initial evaluation for enteric tube placement. EXAM: PORTABLE ABDOMEN - 1 VIEW COMPARISON:  Prior CT from earlier the same day. FINDINGS: Enteric tube in place with tip in side hole overlying the stomach, beyond the GE junction. Tip points towards the gastric fundus. Few mildly dilated loops of small bowel seen within the mid abdomen, consistent with known obstruction. Findings better evaluated on recent CT. IMPRESSION: Tip and side hole of enteric tube overlying the stomach, well beyond the GE junction. Electronically Signed   By: Jeannine Boga M.D.   On: 06/29/2017 23:57    Anti-infectives: Anti-infectives (From admission, onward)   None      Assessment/Plan: Small bowel obstruction of unclear etiology Improved with NG decompression  Chloraseptic spray SBO protocol - plain films around noon today.   LOS: 1 day    Lucas Murray 06/30/2017

## 2017-06-30 NOTE — Progress Notes (Signed)
Suction off to NG tube at this time. Gastrograffin administered via NG tube. Suction resumed after tube had been disconnect from suction for one hour. Intermittent low wall suction to right nare NG tube resumed at 0500, pt tolerated well. No further emesis at this time.

## 2017-07-01 ENCOUNTER — Inpatient Hospital Stay (HOSPITAL_COMMUNITY): Payer: No Typology Code available for payment source | Admitting: Certified Registered Nurse Anesthetist

## 2017-07-01 ENCOUNTER — Encounter (HOSPITAL_COMMUNITY): Admission: EM | Disposition: A | Discharge status: Home / Self Care | Payer: Self-pay | Source: Home / Self Care

## 2017-07-01 ENCOUNTER — Encounter (HOSPITAL_COMMUNITY): Payer: Self-pay | Admitting: *Deleted

## 2017-07-01 ENCOUNTER — Inpatient Hospital Stay (HOSPITAL_COMMUNITY): Payer: No Typology Code available for payment source

## 2017-07-01 HISTORY — PX: COLON RESECTION: SHX5231

## 2017-07-01 SURGERY — LAPAROSCOPIC RIGHT COLON RESECTION
Anesthesia: General | Site: Abdomen

## 2017-07-01 MED ORDER — HYDROMORPHONE HCL 1 MG/ML IJ SOLN
INTRAMUSCULAR | Status: AC
  Administered 2017-07-01: 0.5 mg via INTRAVENOUS
  Filled 2017-07-01: qty 2

## 2017-07-01 MED ORDER — LACTATED RINGERS IR SOLN
Status: DC | PRN
  Administered 2017-07-01: 1000 mL

## 2017-07-01 MED ORDER — HYDROMORPHONE HCL 1 MG/ML IJ SOLN
0.2500 mg | INTRAMUSCULAR | Status: DC | PRN
  Administered 2017-07-01 (×3): 0.5 mg via INTRAVENOUS

## 2017-07-01 MED ORDER — MIDAZOLAM HCL 5 MG/5ML IJ SOLN
INTRAMUSCULAR | Status: DC | PRN
  Administered 2017-07-01: 1 mg via INTRAVENOUS

## 2017-07-01 MED ORDER — MEPERIDINE HCL 50 MG/ML IJ SOLN: 6.2500 mg | INTRAMUSCULAR | Status: DC | PRN

## 2017-07-01 MED ORDER — FENTANYL CITRATE (PF) 250 MCG/5ML IJ SOLN
INTRAMUSCULAR | Status: DC | PRN
  Administered 2017-07-01 (×7): 50 ug via INTRAVENOUS

## 2017-07-01 MED ORDER — PROPOFOL 10 MG/ML IV BOLUS
INTRAVENOUS | Status: DC | PRN
  Administered 2017-07-01: 160 mg via INTRAVENOUS

## 2017-07-01 MED ORDER — PROPOFOL 10 MG/ML IV BOLUS
INTRAVENOUS | Status: AC
  Filled 2017-07-01: qty 20

## 2017-07-01 MED ORDER — POTASSIUM CHLORIDE IN NACL 20-0.9 MEQ/L-% IV SOLN
INTRAVENOUS | Status: DC
  Administered 2017-07-01 – 2017-07-04 (×9): via INTRAVENOUS
  Administered 2017-07-04: 125 mL/h via INTRAVENOUS
  Administered 2017-07-05: 22:00:00 via INTRAVENOUS
  Administered 2017-07-05: 125 mL/h via INTRAVENOUS
  Administered 2017-07-06 – 2017-07-07 (×3): via INTRAVENOUS
  Filled 2017-07-01 (×19): qty 1000

## 2017-07-01 MED ORDER — BUPIVACAINE-EPINEPHRINE 0.25% -1:200000 IJ SOLN
INTRAMUSCULAR | Status: DC | PRN
  Administered 2017-07-01: 43 mL

## 2017-07-01 MED ORDER — BUPIVACAINE-EPINEPHRINE 0.25% -1:200000 IJ SOLN
INTRAMUSCULAR | Status: AC
  Filled 2017-07-01: qty 1

## 2017-07-01 MED ORDER — CIPROFLOXACIN IN D5W 400 MG/200ML IV SOLN
400.0000 mg | INTRAVENOUS | Status: AC
  Administered 2017-07-01: 400 mg via INTRAVENOUS

## 2017-07-01 MED ORDER — PROMETHAZINE HCL 25 MG/ML IJ SOLN: 6.2500 mg | INTRAMUSCULAR | Status: DC | PRN

## 2017-07-01 MED ORDER — OXYCODONE HCL 5 MG PO TABS: 5.0000 mg | ORAL_TABLET | Freq: Once | ORAL | Status: DC | PRN

## 2017-07-01 MED ORDER — LIDOCAINE 2% (20 MG/ML) 5 ML SYRINGE
INTRAMUSCULAR | Status: DC | PRN
  Administered 2017-07-01: 1.5 mg/kg/h via INTRAVENOUS

## 2017-07-01 MED ORDER — DEXAMETHASONE SODIUM PHOSPHATE 10 MG/ML IJ SOLN
INTRAMUSCULAR | Status: DC | PRN
  Administered 2017-07-01: 8 mg via INTRAVENOUS

## 2017-07-01 MED ORDER — 0.9 % SODIUM CHLORIDE (POUR BTL) OPTIME
TOPICAL | Status: DC | PRN
  Administered 2017-07-01: 4000 mL

## 2017-07-01 MED ORDER — FENTANYL CITRATE (PF) 100 MCG/2ML IJ SOLN
INTRAMUSCULAR | Status: AC
  Filled 2017-07-01: qty 2

## 2017-07-01 MED ORDER — KETAMINE HCL 10 MG/ML IJ SOLN
INTRAMUSCULAR | Status: AC
  Filled 2017-07-01: qty 1

## 2017-07-01 MED ORDER — BUPIVACAINE LIPOSOME 1.3 % IJ SUSP
20.0000 mL | Freq: Once | INTRAMUSCULAR | Status: DC
  Filled 2017-07-01: qty 20

## 2017-07-01 MED ORDER — ROCURONIUM BROMIDE 50 MG/5ML IV SOSY
PREFILLED_SYRINGE | INTRAVENOUS | Status: AC
  Filled 2017-07-01: qty 5

## 2017-07-01 MED ORDER — SUGAMMADEX SODIUM 200 MG/2ML IV SOLN
INTRAVENOUS | Status: AC
  Filled 2017-07-01: qty 2

## 2017-07-01 MED ORDER — MORPHINE SULFATE (PF) 2 MG/ML IV SOLN
2.0000 mg | INTRAVENOUS | Status: DC | PRN
  Administered 2017-07-01 – 2017-07-02 (×5): 2 mg via INTRAVENOUS
  Filled 2017-07-01 (×5): qty 1

## 2017-07-01 MED ORDER — CIPROFLOXACIN IN D5W 400 MG/200ML IV SOLN
INTRAVENOUS | Status: AC
  Filled 2017-07-01: qty 200

## 2017-07-01 MED ORDER — LIDOCAINE 2% (20 MG/ML) 5 ML SYRINGE
INTRAMUSCULAR | Status: AC
  Filled 2017-07-01: qty 5

## 2017-07-01 MED ORDER — OXYCODONE HCL 5 MG/5ML PO SOLN
5.0000 mg | Freq: Once | ORAL | Status: DC | PRN
  Filled 2017-07-01: qty 5

## 2017-07-01 MED ORDER — SUCCINYLCHOLINE CHLORIDE 20 MG/ML IJ SOLN
INTRAMUSCULAR | Status: DC | PRN
  Administered 2017-07-01: 140 mg via INTRAVENOUS

## 2017-07-01 MED ORDER — SUGAMMADEX SODIUM 200 MG/2ML IV SOLN
INTRAVENOUS | Status: DC | PRN
  Administered 2017-07-01: 400 mg via INTRAVENOUS

## 2017-07-01 MED ORDER — LACTATED RINGERS IV SOLN
INTRAVENOUS | Status: DC | PRN
  Administered 2017-07-01 (×2): via INTRAVENOUS

## 2017-07-01 MED ORDER — LIDOCAINE HCL (PF) 2 % IJ SOLN
INTRAMUSCULAR | Status: DC | PRN
  Administered 2017-07-01: 60 mg via INTRADERMAL

## 2017-07-01 MED ORDER — LACTATED RINGERS IV SOLN
INTRAVENOUS | Status: DC
  Administered 2017-07-01: 12:00:00 via INTRAVENOUS

## 2017-07-01 MED ORDER — KETAMINE HCL 10 MG/ML IJ SOLN
INTRAMUSCULAR | Status: DC | PRN
  Administered 2017-07-01: 10 mg via INTRAVENOUS
  Administered 2017-07-01: 40 mg via INTRAVENOUS

## 2017-07-01 MED ORDER — FENTANYL CITRATE (PF) 250 MCG/5ML IJ SOLN
INTRAMUSCULAR | Status: AC
  Filled 2017-07-01: qty 5

## 2017-07-01 MED ORDER — ROCURONIUM BROMIDE 100 MG/10ML IV SOLN
INTRAVENOUS | Status: DC | PRN
  Administered 2017-07-01: 5 mg via INTRAVENOUS
  Administered 2017-07-01: 20 mg via INTRAVENOUS
  Administered 2017-07-01: 10 mg via INTRAVENOUS
  Administered 2017-07-01: 30 mg via INTRAVENOUS

## 2017-07-01 MED ORDER — SUCCINYLCHOLINE CHLORIDE 200 MG/10ML IV SOSY
PREFILLED_SYRINGE | INTRAVENOUS | Status: AC
  Filled 2017-07-01: qty 10

## 2017-07-01 MED ORDER — METRONIDAZOLE IN NACL 5-0.79 MG/ML-% IV SOLN
500.0000 mg | INTRAVENOUS | Status: AC
  Administered 2017-07-01: 500 mg via INTRAVENOUS
  Filled 2017-07-01: qty 100

## 2017-07-01 MED ORDER — MIDAZOLAM HCL 2 MG/2ML IJ SOLN
INTRAMUSCULAR | Status: AC
  Filled 2017-07-01: qty 2

## 2017-07-01 SURGICAL SUPPLY — 64 items
APPLIER CLIP ROT 10 11.4 M/L (STAPLE)
BLADE EXTENDED COATED 6.5IN (ELECTRODE) IMPLANT
CABLE HIGH FREQUENCY MONO STRZ (ELECTRODE) ×3 IMPLANT
CELLS DAT CNTRL 66122 CELL SVR (MISCELLANEOUS) IMPLANT
CLIP APPLIE ROT 10 11.4 M/L (STAPLE) IMPLANT
COUNTER NEEDLE 20 DBL MAG RED (NEEDLE) IMPLANT
COVER MAYO STAND STRL (DRAPES) IMPLANT
COVER SURGICAL LIGHT HANDLE (MISCELLANEOUS) ×6 IMPLANT
DECANTER SPIKE VIAL GLASS SM (MISCELLANEOUS) IMPLANT
DERMABOND ADVANCED (GAUZE/BANDAGES/DRESSINGS)
DERMABOND ADVANCED .7 DNX12 (GAUZE/BANDAGES/DRESSINGS) IMPLANT
DRAIN CHANNEL 19F RND (DRAIN) IMPLANT
DRAPE LAPAROSCOPIC ABDOMINAL (DRAPES) ×3 IMPLANT
DRAPE SURG IRRIG POUCH 19X23 (DRAPES) IMPLANT
DRSG OPSITE POSTOP 4X10 (GAUZE/BANDAGES/DRESSINGS) ×3 IMPLANT
DRSG OPSITE POSTOP 4X6 (GAUZE/BANDAGES/DRESSINGS) IMPLANT
DRSG OPSITE POSTOP 4X8 (GAUZE/BANDAGES/DRESSINGS) IMPLANT
ELECT PENCIL ROCKER SW 15FT (MISCELLANEOUS) ×3 IMPLANT
ELECT REM PT RETURN 15FT ADLT (MISCELLANEOUS) ×3 IMPLANT
GAUZE SPONGE 4X4 12PLY STRL (GAUZE/BANDAGES/DRESSINGS) IMPLANT
GLOVE BIOGEL PI IND STRL 7.5 (GLOVE) ×2 IMPLANT
GLOVE BIOGEL PI INDICATOR 7.5 (GLOVE) ×4
GLOVE ECLIPSE 7.5 STRL STRAW (GLOVE) ×6 IMPLANT
GOWN STRL REUS W/TWL XL LVL3 (GOWN DISPOSABLE) ×15 IMPLANT
LEGGING LITHOTOMY PAIR STRL (DRAPES) IMPLANT
LIGASURE IMPACT 36 18CM CVD LR (INSTRUMENTS) ×3 IMPLANT
PACK COLON (CUSTOM PROCEDURE TRAY) ×3 IMPLANT
PAD POSITIONING PINK XL (MISCELLANEOUS) ×3 IMPLANT
PORT LAP GEL ALEXIS MED 5-9CM (MISCELLANEOUS) IMPLANT
POSITIONER SURGICAL ARM (MISCELLANEOUS) IMPLANT
RELOAD BLUE CHELON 45 (STAPLE) IMPLANT
RELOAD PROXIMATE 75MM BLUE (ENDOMECHANICALS) ×9 IMPLANT
RTRCTR WOUND ALEXIS 18CM MED (MISCELLANEOUS)
SCISSORS LAP 5X35 DISP (ENDOMECHANICALS) ×3 IMPLANT
SEALER TISSUE X1 CVD JAW (INSTRUMENTS) IMPLANT
SET IRRIG TUBING LAPAROSCOPIC (IRRIGATION / IRRIGATOR) ×3 IMPLANT
SHEARS HARMONIC ACE PLUS 36CM (ENDOMECHANICALS) IMPLANT
SLEEVE ADV FIXATION 5X100MM (TROCAR) ×6 IMPLANT
STAPLER PROXIMATE 75MM BLUE (STAPLE) ×3 IMPLANT
STAPLER VISISTAT 35W (STAPLE) IMPLANT
SUT CHROMIC 2 0 SH (SUTURE) ×6 IMPLANT
SUT PDS AB 0 CT1 36 (SUTURE) IMPLANT
SUT PDS AB 1 CTX 36 (SUTURE) IMPLANT
SUT PDS AB 1 TP1 96 (SUTURE) ×3 IMPLANT
SUT PROLENE 2 0 KS (SUTURE) IMPLANT
SUT PROLENE 3 0 SH 48 (SUTURE) IMPLANT
SUT SILK 2 0 (SUTURE) ×2
SUT SILK 2 0 SH CR/8 (SUTURE) ×6 IMPLANT
SUT SILK 2-0 18XBRD TIE 12 (SUTURE) ×1 IMPLANT
SUT SILK 3 0 (SUTURE) ×2
SUT SILK 3 0 SH CR/8 (SUTURE) ×3 IMPLANT
SUT SILK 3-0 18XBRD TIE 12 (SUTURE) ×1 IMPLANT
SUT VICRYL 0 UR6 27IN ABS (SUTURE) ×3 IMPLANT
SYR 10ML ECCENTRIC (SYRINGE) ×3 IMPLANT
SYS LAPSCP GELPORT 120MM (MISCELLANEOUS)
SYSTEM LAPSCP GELPORT 120MM (MISCELLANEOUS) IMPLANT
TAPE CLOTH 4X10 WHT NS (GAUZE/BANDAGES/DRESSINGS) IMPLANT
TOWEL OR NON WOVEN STRL DISP B (DISPOSABLE) ×6 IMPLANT
TRAY FOLEY W/METER SILVER 16FR (SET/KITS/TRAYS/PACK) ×3 IMPLANT
TROCAR ADV FIXATION 12X100MM (TROCAR) IMPLANT
TROCAR ADV FIXATION 5X100MM (TROCAR) ×3 IMPLANT
TROCAR BLADELESS OPT 5 100 (ENDOMECHANICALS) IMPLANT
TROCAR XCEL BLUNT TIP 100MML (ENDOMECHANICALS) ×3 IMPLANT
TUBING INSUF HEATED (TUBING) ×3 IMPLANT

## 2017-07-01 NOTE — Progress Notes (Signed)
Patient ID: Lucas Murray, male   DOB: Apr 21, 1971, 46 y.o.   MRN: [id number]     Subjective: Feels the same.  Still intermittent generalized abdominal cramping and tightness.  No bowel movements or flatus.  No constant pain.  He denies any chronic GI symptoms prior to this acute illness.  Objective: Vital signs in last 24 hours: Temp:  [98.1 F (36.7 C)-98.6 F (37 C)] 98.4 F (36.9 C) (11/19 0523) Pulse Rate:  [88-90] 88 (11/19 0523) Resp:  [16] 16 (11/19 0523) BP: (124-133)/(83-88) 124/84 (11/19 0523) SpO2:  [93 %-99 %] 93 % (11/19 0523)    Intake/Output from previous day: 11/18 0701 - 11/19 0700 In: 800 [I.V.:800] Out: 1700 [Urine:850; Emesis/NG output:850] Intake/Output this shift: No intake/output data recorded.  General appearance: alert, cooperative and no distress GI: Mildly distended.  Has a few high-pitched bowel sounds.  Soft and nontender.  No palpable masses.  Lab Results:  Recent Labs    06/29/17 1456 06/30/17 0404  WBC 15.7* 16.4*  HGB 17.3* 16.4  HCT 47.9 46.7  PLT 260 263   BMET Recent Labs    06/29/17 1456 06/30/17 0404  NA 136 138  K 4.9 4.1  CL 103 105  CO2 21* 25  GLUCOSE 155* 178*  BUN 14 17  CREATININE 1.16 1.04  CALCIUM 10.4* 9.3     Studies/Results: Ct Abdomen Pelvis W Contrast  Result Date: 06/29/2017 CLINICAL DATA:  46 year old male with abdominal pain. Concern for gastroenteritis or colitis. EXAM: CT ABDOMEN AND PELVIS WITH CONTRAST TECHNIQUE: Multidetector CT imaging of the abdomen and pelvis was performed using the standard protocol following bolus administration of intravenous contrast. CONTRAST:  59mL ISOVUE-300 IOPAMIDOL (ISOVUE-300) INJECTION 61% COMPARISON:  None. FINDINGS: Lower chest: There are areas of bibasilar atelectasis/scarring. There is no intra-abdominal free air.  Small ascites. Hepatobiliary: Probable mild fatty infiltration of the liver. No intrahepatic biliary ductal dilatation. The gallbladder is unremarkable.  Pancreas: The pancreas is slightly atrophic for the patient's age. No inflammatory changes. There is no dilatation of the main pancreatic duct. Spleen: Normal in size without focal abnormality. Adrenals/Urinary Tract: The adrenal glands are unremarkable. There is a solitary left kidney which appears unremarkable. The visualized left ureter and urinary bladder are grossly unremarkable. Stomach/Bowel: There is thickened appearance of the terminal ileum which may be related to underdistention or represent mild inflammation. There is a focal area of high grade stricture in the distal/terminal ileum in the right hemi pelvis (series 2, image 72 and coronal series 4 image 96) with associated obstruction. There is tethering of the transition point to the adjacent small bowel loop suggestive of underlying adhesion/scarring. There is fecalization of the distended small bowel proximal to this point as well as diffuse dilatation of the fluid-filled loops of small bowel more proximally. There is stool within the colon likely representing passage of content prior to the high-grade obstruction an suggestive of recent or gradual development of this degree of obstruction. The stomach is distended with oral contrast. There is a small hiatal hernia with gastroesophageal reflux. The visualized appendix appears unremarkable. Vascular/Lymphatic: The abdominal aorta and IVC appear unremarkable. No portal venous gas identified. 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 There is no adenopathy. Reproductive: The prostate gland is grossly unremarkable. 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 The left seminal vesicle is unremarkable. Other: Small fat containing umbilical hernia. Musculoskeletal: No acute or significant osseous findings. IMPRESSION: 1. High-grade small bowel  obstruction with transition zone in the distal/terminal in the right hemipelvis likely related to  adhesion/stricture. Underlying mass is not entirely excluded. Clinical correlation and surgical consult is advised. There is mild thickened appearance of the terminal ileum likely representing some inflammatory changes. 2. 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 3. 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 Follow-up in 1 year recommended. Further evaluation with angiography recommended. Electronically Signed   By: Anner Crete M.D.   On: 06/29/2017 20:05   Dg Abd Portable 1v  Result Date: 07/01/2017 CLINICAL DATA:  Small-bowel obstruction EXAM: PORTABLE ABDOMEN - 1 VIEW COMPARISON:  06/30/2017 FINDINGS: Mild improvement in small bowel dilatation. Colon is decompressed. Negative skeletal structures. Stomach and NG tube not included on this image. IMPRESSION: Mild improvement in small bowel dilatation. Electronically Signed   By: Franchot Gallo M.D.   On: 07/01/2017 06:45   Dg Abd Portable 1v-small Bowel Obstruction Protocol-initial, 8 Hr Delay  Result Date: 06/30/2017 CLINICAL DATA:  Small bowel obstruction EXAM: PORTABLE ABDOMEN - 1 VIEW COMPARISON:  06/29/2017 FINDINGS: NG tube tip is in the proximal stomach. Dilated small bowel loops through the abdomen and pelvis are again noted compatible with high-grade small bowel obstruction. No free air organomegaly. IMPRESSION: Continued high-grade small bowel obstruction pattern. NG tube is in the proximal stomach. Electronically Signed   By: Rolm Baptise M.D.   On: 06/30/2017 12:14   Dg Abd Portable 1v-small Bowel Protocol-position Verification  Result Date: 06/29/2017 CLINICAL DATA:  Initial evaluation for enteric tube placement. EXAM: PORTABLE ABDOMEN - 1 VIEW COMPARISON:  Prior CT from earlier the same day. FINDINGS: Enteric tube in place with tip in side hole overlying the stomach, beyond the GE junction. Tip points towards the gastric fundus. Few mildly dilated loops of small bowel seen within the mid abdomen, consistent  with known obstruction. Findings better evaluated on recent CT. IMPRESSION: Tip and side hole of enteric tube overlying the stomach, well beyond the GE junction. Electronically Signed   By: Jeannine Boga M.D.   On: 06/29/2017 23:57    Anti-infectives: Anti-infectives (From admission, onward)   None      Assessment/Plan: High-grade small bowel obstruction in this generally healthy male with no previous abdominal surgery or GI history.  Multiple possibilities including enteritis, inflammatory adhesions or tumor.  X-rays appear slightly better but overall I do not see any significant clinical improvement.  There was fecalization on his CT scan indicating a more long-term progressive problem.  This was all discussed in detail with the patient and his wife.  With these findings I think it is very likely he will need surgery and I discussed proceeding with initially laparoscopy and likely laparotomy with possible bowel resection.  Option of continued observation was discussed although as I told them I do not think this is likely to resolve nonoperatively.  Currently I see no evidence of complication without tenderness or worsening pain.  At this point they would like to continue nonoperative management for now and consider options and I do not think this is unreasonable.    LOS: 2 days    Edward Jolly 07/01/2017

## 2017-07-01 NOTE — Op Note (Signed)
Preoperative Diagnosis: Small bowel obstruction   Postoprative Diagnosis: Same  Procedure: Procedure(s): Laparoscopic assisted ileocecectomy with anastomosis   Surgeon: Excell Seltzer T   Assistants: Margie Billet  Anesthesia:  General endotracheal anesthesia  Indications: Patient is a 46 year old male with no previous GI history or abdominal surgery who presents with persistent abdominal pain and vomiting with CT scan showing a high-grade small bowel obstruction at the terminal ileum.  With conservative measures he is failed to improve.  After extensive discussion detailed elsewhere we have elected to proceed with surgical intervention with initially laparoscopy and possible laparotomy, possible bowel resection.  The procedure and indications, possible findings and risks were discussed extensively with the patient and family preoperatively and they agree to proceed.    Procedure Detail: Patient was taken to the operating room, placed in the supine position on the operating table, and general endotracheal anesthesia induced.  Foley catheter was placed.  The abdomen was widely sterilely prepped and draped.  He received preoperative IV antibiotics.  PAS were in place.  Patient timeout was performed and correct procedure verified.  Initially laparoscopic access was obtained with a 1-1/2 cm incision just below the umbilicus with an open Hassan technique and pneumoperitoneum was established.  On entering the peritoneal cavity there was a large amount of straw covered ascitic fluid consistent with bowel obstruction.  Under direct vision 5 mm trochars were placed in the lower and upper midline abdomen.  The patient was placed in Trendelenburg and rotated to expose the right lower quadrant.  The small bowel was diffusely very dilated with some patchy mild hemorrhagic areas and congestion consistent with marked bowel obstruction.  The cecum and right colon were identified and were decompressed.  I exposed  the terminal ileum which was fairly densely adherent down onto the right pelvic sidewall at the inlet of the pelvis.  There appeared to be a transition point at this area which was about 5 cm from the ileocecal valve with the very terminal few centimeters of ileum appearing decompressed and just proximal to this point obviously very dilated and congested.  I then proceeded to mobilize the cecum and right colon and terminal ileum laparoscopically dividing lateral peritoneal attachments and reflecting the bowel medially.  There were some fairly unusual dense attachments or adhesions of the terminal ileum at the point of obstruction to the pelvic sidewall.  These were able to be all lysed mobilizing this area.  The bowel here was somewhat thickened and contains semisolid stool but I did not see any evidence of mass or obvious severe inflammation.  It was not clear to me that the degree of kinking or adhesion of the terminal ileum was enough to explain the degree of obstruction he had and due to concern about intrinsic abnormality in the ileum I felt the safest approach was to proceed with ileocecectomy.  At this point due to the significant distention of the small bowel I felt I had mobilized everything as well as I could laparoscopically.  We then converted to an open incision skirting the umbilicus and dissection was carried down through the subcutaneous tissue and midline fascia and the peritoneum divided.  Some further mobilization of the terminal ileum and right colon were performed to enable [place] to bring this up and out through the incision.  There was fecalization of the terminal ileum and congestion and possibly some mild creeping fat but no palpable mass or severe inflammation to explain the high degree of obstruction.  A point of ileum  about 15 cm above what appeared to be the point of obstruction where the bowel was more normal in appearance was chosen for proximal resection.  This was divided with the GIA 75  mm stapler.  The right colon was further mobilized and I divided the mid to proximal right colon with the GIA 75 mm stapler.  A functional end-to-end anastomosis was then created with the GIA 75 mm stapler.  The distal small bowel was decompressed somewhat through the enterotomy.  This was widely patent.  No tension.  Due to thickening of the bowel wall I closed the common enterotomy in 2 layers with running 2-0 chromic and seromuscular 2-0 silk.  The anastomosis was widely patent, appeared to have adequate blood supply and there was no evidence of leak with pressure.  The viscera were returned to the abdomen and gloves and instruments changed.  The abdomen was thoroughly irrigated with several liters of warm saline.  There was no bleeding or other evidence of problem.  The midline fascia was closed with running looped #1 PDS begun at either end of the incision and tied centrally.  Subcutaneous tissue was irrigated and skin closed with staples.  Sponge needle and instrument counts were correct.    Findings: As above  Estimated Blood Loss:  less than 50 mL         Drains: None  Blood Given: none          Specimens: Terminal ileum and cecum        Complications:  * No complications entered in OR log *         Disposition: PACU - hemodynamically stable.         Condition: stable

## 2017-07-01 NOTE — Anesthesia Preprocedure Evaluation (Signed)
Anesthesia Evaluation  Patient identified by MRN, date of birth, ID band Patient awake    Reviewed: Allergy & Precautions, NPO status , Patient's Chart, lab work & pertinent test results  Airway Mallampati: II  TM Distance: >3 FB Neck ROM: Full    Dental no notable dental hx.    Pulmonary neg pulmonary ROS,    Pulmonary exam normal breath sounds clear to auscultation       Cardiovascular negative cardio ROS Normal cardiovascular exam Rhythm:Regular Rate:Normal     Neuro/Psych negative neurological ROS  negative psych ROS   GI/Hepatic negative GI ROS, Neg liver ROS, GERD  ,  Endo/Other  negative endocrine ROS  Renal/GU negative Renal ROS  negative genitourinary   Musculoskeletal negative musculoskeletal ROS (+)   Abdominal (+) + obese,   Peds negative pediatric ROS (+)  Hematology negative hematology ROS (+)   Anesthesia Other Findings   Reproductive/Obstetrics negative OB ROS                             Anesthesia Physical Anesthesia Plan  ASA: II  Anesthesia Plan: General   Post-op Pain Management:    Induction: Intravenous  PONV Risk Score and Plan: 2 and Ondansetron and Midazolam  Airway Management Planned: Oral ETT  Additional Equipment:   Intra-op Plan:   Post-operative Plan: Extubation in OR  Informed Consent: I have reviewed the patients History and Physical, chart, labs and discussed the procedure including the risks, benefits and alternatives for the proposed anesthesia with the patient or authorized representative who has indicated his/her understanding and acceptance.   Dental advisory given  Plan Discussed with: CRNA  Anesthesia Plan Comments:         Anesthesia Quick Evaluation

## 2017-07-01 NOTE — Anesthesia Procedure Notes (Signed)
Procedure Name: Intubation Date/Time: 07/01/2017 1:29 PM Performed by: [place] Axelle Szwed C, CRNA Pre-anesthesia Checklist: Patient identified, Emergency Drugs available, Suction available and Patient being monitored Patient Re-evaluated:Patient Re-evaluated prior to induction Oxygen Delivery Method: Circle system utilized Preoxygenation: Pre-oxygenation with 100% oxygen Induction Type: IV induction Laryngoscope Size: Mac and 4 Grade View: Grade I Tube type: Oral Tube size: 7.5 mm Number of attempts: 1 Airway Equipment and Method: Stylet Placement Confirmation: ETT inserted through vocal cords under direct vision,  positive ETCO2 and breath sounds checked- equal and bilateral Secured at: 22 cm Tube secured with: Tape Dental Injury: Teeth and Oropharynx as per pre-operative assessment

## 2017-07-01 NOTE — Transfer of Care (Signed)
Immediate Anesthesia Transfer of Care Note  Patient: Lucas Murray  Procedure(s) Performed: LAPAROSCOPY, LAPAROTOMY WITH ILEO-CECECTOMY (N/A Abdomen)  Patient Location: PACU  Anesthesia Type:General  Level of Consciousness: drowsy and patient cooperative  Airway & Oxygen Therapy: Patient Spontanous Breathing and Patient connected to face mask  Post-op Assessment: Report given to RN and Post -op Vital signs reviewed and stable  Post vital signs: Reviewed and stable  Last Vitals:  Vitals:   06/30/17 2130 07/01/17 0523  BP: 133/83 124/84  Pulse: 90 88  Resp: 16 16  Temp: 37 C 36.9 C  SpO2: 94% 93%    Last Pain:  Vitals:   07/01/17 0523  TempSrc: Oral  PainSc:          Complications: No apparent anesthesia complications

## 2017-07-02 LAB — BASIC METABOLIC PANEL
ANION GAP: 8 (ref 5–15)
BUN: 20 mg/dL (ref 6–20)
CHLORIDE: 105 mmol/L (ref 101–111)
CO2: 28 mmol/L (ref 22–32)
Calcium: 8.9 mg/dL (ref 8.9–10.3)
Creatinine, Ser: 1.07 mg/dL (ref 0.61–1.24)
GFR calc non Af Amer: >60 mL/min (ref >60)
Glucose, Bld: 125 mg/dL — ABNORMAL HIGH (ref 65–99)
POTASSIUM: 4.2 mmol/L (ref 3.5–5.1)
Sodium: 141 mmol/L (ref 135–145)

## 2017-07-02 LAB — CBC
HEMATOCRIT: 41.8 % (ref 39.0–52.0)
HEMOGLOBIN: 14 g/dL (ref 13.0–17.0)
MCH: 30.7 pg (ref 26.0–34.0)
MCHC: 33.5 g/dL (ref 30.0–36.0)
MCV: 91.7 fL (ref 78.0–100.0)
Platelets: 210 10*3/uL (ref 150–400)
RBC: 4.56 MIL/uL (ref 4.22–5.81)
RDW: 12.7 % (ref 11.5–15.5)
WBC: 7.6 10*3/uL (ref 4.0–10.5)

## 2017-07-02 MED ORDER — MORPHINE SULFATE (PF) 2 MG/ML IV SOLN
2.0000 mg | INTRAVENOUS | Status: DC | PRN
  Administered 2017-07-02 (×2): 2 mg via INTRAVENOUS
  Filled 2017-07-02 (×3): qty 1

## 2017-07-02 MED ORDER — METHOCARBAMOL 1000 MG/10ML IJ SOLN
500.0000 mg | Freq: Three times a day (TID) | INTRAMUSCULAR | Status: DC | PRN
  Administered 2017-07-02 – 2017-07-03 (×2): 500 mg via INTRAVENOUS
  Filled 2017-07-02 (×2): qty 550

## 2017-07-02 NOTE — Anesthesia Postprocedure Evaluation (Signed)
Anesthesia Post Note  Patient: Lucas Murray  Procedure(s) Performed: LAPAROSCOPY, LAPAROTOMY WITH ILEO-CECECTOMY (N/A Abdomen)     Anesthesia Type: General    Last Vitals:  Vitals:   07/02/17 0034 07/02/17 0641  BP: 129/80 124/79  Pulse: 89 91  Resp: 18 18  Temp: 37.3 C 36.9 C  SpO2: 98% 100%    Last Pain:  Vitals:   07/02/17 0641  TempSrc: Oral  PainSc:                  Lynda Rainwater

## 2017-07-02 NOTE — Progress Notes (Signed)
Patient ID: Lucas Murray, male   DOB: October 17, 1970, 46 y.o.   MRN: [id number]  [facility] Surgery Progress Note  1 Day Post-Op  Subjective: CC-  Patient reports abdominal soreness and some distension this morning. No n/v. No flatus or BM yet. Has already ambulated once this morning.  Objective: Vital signs in last 24 hours: Temp:  [98.2 F (36.8 C)-99.2 F (37.3 C)] 98.4 F (36.9 C) (11/20 0641) Pulse Rate:  [87-100] 91 (11/20 0641) Resp:  [15-18] 18 (11/20 0641) BP: (124-150)/(71-96) 124/79 (11/20 0641) SpO2:  [94 %-100 %] 100 % (11/20 0641)    Intake/Output from previous day: 11/19 0701 - 11/20 0700 In: 2720.8 [I.V.:2720.8] Out: 1750 [Urine:1250; Emesis/NG output:450; Blood:50] Intake/Output this shift: No intake/output data recorded.  PE: Gen:  Alert, NAD HEENT: EOM's intact, pupils equal and round Card:  RRR, no M/G/R heard Pulm:  CTAB, no W/R/R, effort normal Abd: Soft, distended, hypoactive BS, midline incision C/D/I with staples intact and honeycomb dressing in place Psych: A&Ox3   Lab Results:  Recent Labs    06/30/17 0404 07/02/17 0505  WBC 16.4* 7.6  HGB 16.4 14.0  HCT 46.7 41.8  PLT 263 210   BMET Recent Labs    06/30/17 0404 07/02/17 0505  NA 138 141  K 4.1 4.2  CL 105 105  CO2 25 28  GLUCOSE 178* 125*  BUN 17 20  CREATININE 1.04 1.07  CALCIUM 9.3 8.9   PT/INR No results for input(s): LABPROT, INR in the last 72 hours. CMP     Component Value Date/Time   NA 141 07/02/2017 0505   K 4.2 07/02/2017 0505   CL 105 07/02/2017 0505   CO2 28 07/02/2017 0505   GLUCOSE 125 (H) 07/02/2017 0505   BUN 20 07/02/2017 0505   CREATININE 1.07 07/02/2017 0505   CREATININE 1.15 12/30/2014 0907   CALCIUM 8.9 07/02/2017 0505   PROT 8.1 06/29/2017 1456   ALBUMIN 5.0 06/29/2017 1456   AST 41 06/29/2017 1456   ALT 40 06/29/2017 1456   ALKPHOS 59 06/29/2017 1456   BILITOT 1.6 (H) 06/29/2017 1456   GFRNONAA >60 07/02/2017 0505   GFRAA >60 07/02/2017  0505   Lipase     Component Value Date/Time   LIPASE 33 06/29/2017 1456       Studies/Results: Dg Abd Portable 1v  Result Date: 07/01/2017 CLINICAL DATA:  Small-bowel obstruction EXAM: PORTABLE ABDOMEN - 1 VIEW COMPARISON:  06/30/2017 FINDINGS: Mild improvement in small bowel dilatation. Colon is decompressed. Negative skeletal structures. Stomach and NG tube not included on this image. IMPRESSION: Mild improvement in small bowel dilatation. Electronically Signed   By: Franchot Gallo M.D.   On: 07/01/2017 06:45   Dg Abd Portable 1v-small Bowel Obstruction Protocol-initial, 8 Hr Delay  Result Date: 06/30/2017 CLINICAL DATA:  Small bowel obstruction EXAM: PORTABLE ABDOMEN - 1 VIEW COMPARISON:  06/29/2017 FINDINGS: NG tube tip is in the proximal stomach. Dilated small bowel loops through the abdomen and pelvis are again noted compatible with high-grade small bowel obstruction. No free air organomegaly. IMPRESSION: Continued high-grade small bowel obstruction pattern. NG tube is in the proximal stomach. Electronically Signed   By: Rolm Baptise M.D.   On: 06/30/2017 12:14    Anti-infectives: Anti-infectives (From admission, onward)   Start     Dose/Rate Route Frequency Ordered Stop   07/01/17 1206  ciprofloxacin (CIPRO) 400 MG/200ML IVPB    Comments:  Waldron Session   : cabinet override      07/01/17  1206 07/01/17 1332   07/01/17 1145  ciprofloxacin (CIPRO) IVPB 400 mg     400 mg 200 mL/hr over 60 Minutes Intravenous On call to O.R. 07/01/17 1142 07/01/17 1332   07/01/17 1145  metroNIDAZOLE (FLAGYL) IVPB 500 mg     500 mg 100 mL/hr over 60 Minutes Intravenous On call to O.R. 07/01/17 1142 07/01/17 1357       Assessment/Plan SBO S/p laparoscopic assisted ileocecectomy with anastomosis 11/19 Dr. Excell Seltzer - POD 1 - path pending - no flatus or BM  ID - cipro/flagyl perioperative FEN - IVF, NPO/NGT VTE - SCDs, lovenox Foley - none Follow up - 2 weeks for staple  removal  Plan - continue NPO/NG tube until bowel function returns. Encourage ambulation.    LOS: 3 days    Wellington Hampshire , [facility] Surgery 07/02/2017, 9:55 AM Pager: [phone] Consults: [phone] Mon-Fri 7:00 am-4:30 pm Sat-Sun 7:00 am-11:30 am

## 2017-07-02 NOTE — Discharge Instructions (Signed)
[facility], [place] [phone]  OPEN ABDOMINAL SURGERY: POST OP INSTRUCTIONS  Always review your discharge instruction sheet given to you by the facility where your surgery was performed.  IF YOU HAVE DISABILITY OR FAMILY LEAVE FORMS, YOU MUST BRING THEM TO THE OFFICE FOR PROCESSING.  PLEASE DO NOT GIVE THEM TO YOUR DOCTOR.  1. A prescription for pain medication may be given to you upon discharge.  Take your pain medication as prescribed, if needed.  If narcotic pain medicine is not needed, then you may take acetaminophen (Tylenol) or ibuprofen (Advil) as needed. 2. Take your usually prescribed medications unless otherwise directed. 3. If you need a refill on your pain medication, please contact your pharmacy. They will contact our office to request authorization.  Prescriptions will not be filled after 5pm or on week-ends. 4. You should follow a light diet the first few days after arrival home, such as soup and crackers, pudding, etc.unless your doctor has advised otherwise. A high-fiber, low fat diet can be resumed as tolerated.   Be sure to include lots of fluids daily. Most patients will experience some swelling and bruising on the chest and neck area.  Ice packs will help.  Swelling and bruising can take several days to resolve 5. Most patients will experience some swelling and bruising in the area of the incision. Ice pack will help. Swelling and bruising can take several days to resolve..  6. It is common to experience some constipation if taking pain medication after surgery.  Increasing fluid intake and taking a stool softener will usually help or prevent this problem from occurring.  A mild laxative (Milk of Magnesia or Miralax) should be taken according to package directions if there are no bowel movements after 48 hours. 7.  You may remove the honeycomb dressing on 07/06/17.  Staples will be removed at the office during your follow-up visit. You may find that a light  gauze bandage over your incision may keep your staples from being rubbed or pulled. You may shower and replace the bandage daily. 8. ACTIVITIES:  You may resume regular (light) daily activities beginning the next day--such as daily self-care, walking, climbing stairs--gradually increasing activities as tolerated.  You may have sexual intercourse when it is comfortable.  Refrain from any heavy lifting or straining until approved by your doctor. a. You may drive when you no longer are taking prescription pain medication, you can comfortably wear a seatbelt, and you can safely maneuver your car and apply brakes b. Return to Work: ___________________________________ 70. You should see your doctor in the office for a follow-up appointment approximately two weeks after your surgery.  Make sure that you call for this appointment within a day or two after you arrive home to insure a convenient appointment time. OTHER INSTRUCTIONS:  _____________________________________________________________ _____________________________________________________________  WHEN TO CALL YOUR DOCTOR: 1. Fever over 101.0 2. Inability to urinate 3. Nausea and/or vomiting 4. Extreme swelling or bruising 5. Continued bleeding from incision. 6. Increased pain, redness, or drainage from the incision. 7. Difficulty swallowing or breathing 8. Muscle cramping or spasms. 9. Numbness or tingling in hands or feet or around lips.  The clinic staff is available to answer your questions during regular business hours.  Please dont hesitate to call and ask to speak to one of the nurses if you have concerns.  For further questions, please visit [email or web address]

## 2017-07-03 NOTE — Progress Notes (Signed)
   07/03/17 2149  Gastrointestinal  Gastrointestinal (WDL) X  Abdomen Inspection Distended;Taut (Firm) (Dr Marlou Starks notified. Received new order)  Bowel Sounds Assessment Hypoactive  Tenderness Tender  GI Symptoms Distention

## 2017-07-03 NOTE — Progress Notes (Signed)
Patient ID: Lucas Murray, male   DOB: February 08, 1971, 46 y.o.   MRN: [id number] 2 Days Post-Op   Subjective: Less pain today.  Walking and up in chair.  Feels some pressure in his lower abdomen but no flatus or bowel movement yet.  Objective: Vital signs in last 24 hours: Temp:  [98.3 F (36.8 C)-99.2 F (37.3 C)] 98.7 F (37.1 C) (11/21 0611) Pulse Rate:  [92-103] 101 (11/21 0611) Resp:  [16-18] 18 (11/21 0611) BP: (125-135)/(70-80) 125/75 (11/21 0611) SpO2:  [95 %-97 %] 97 % (11/21 0611)    Intake/Output from previous day: 11/20 0701 - 11/21 0700 In: 3110 [I.V.:3000; IV Piggyback:110] Out: 1975 [Urine:1450; Emesis/NG output:525] Intake/Output this shift: Total I/O In: -  Out: 300 [Urine:300]  General appearance: alert, cooperative and no distress Resp: No wheezing or increased work of breathing GI: Mild distention.  No bowel sounds.  Minimal tenderness and no guarding.  Incisional Incision/Wound: Slight bloody drainage, old.  No erythema  Lab Results:  Recent Labs    07/02/17 0505  WBC 7.6  HGB 14.0  HCT 41.8  PLT 210   BMET Recent Labs    07/02/17 0505  NA 141  K 4.2  CL 105  CO2 28  GLUCOSE 125*  BUN 20  CREATININE 1.07  CALCIUM 8.9     Studies/Results: No results found.  Anti-infectives: Anti-infectives (From admission, onward)   Start     Dose/Rate Route Frequency Ordered Stop   07/01/17 1206  ciprofloxacin (CIPRO) 400 MG/200ML IVPB    Comments:  [place]aldron Session   : cabinet override      07/01/17 1206 07/01/17 1332   07/01/17 1145  ciprofloxacin (CIPRO) IVPB 400 mg     400 mg 200 mL/hr over 60 Minutes Intravenous On call to O.R. 07/01/17 1142 07/01/17 1332   07/01/17 1145  metroNIDAZOLE (FLAGYL) IVPB 500 mg     500 mg 100 mL/hr over 60 Minutes Intravenous On call to O.R. 07/01/17 1142 07/01/17 1357      Assessment/Plan: s/p Procedure(s): LAPAROSCOPY, LAPAROTOMY WITH ILEO-CECECTOMY Small bowel obstruction the terminal ileum.  Pathology  pending. Stable postoperatively with expected ileus.  Continue NG today.   LOS: 4 days    Edward Jolly 07/03/2017

## 2017-07-04 ENCOUNTER — Inpatient Hospital Stay (HOSPITAL_COMMUNITY): Payer: No Typology Code available for payment source

## 2017-07-04 LAB — BASIC METABOLIC PANEL
ANION GAP: 9 (ref 5–15)
BUN: 18 mg/dL (ref 6–20)
CALCIUM: 8.7 mg/dL — AB (ref 8.9–10.3)
CO2: 24 mmol/L (ref 22–32)
Chloride: 107 mmol/L (ref 101–111)
Creatinine, Ser: 0.91 mg/dL (ref 0.61–1.24)
Glucose, Bld: 101 mg/dL — ABNORMAL HIGH (ref 65–99)
Potassium: 3.6 mmol/L (ref 3.5–5.1)
SODIUM: 140 mmol/L (ref 135–145)

## 2017-07-04 LAB — CBC
HEMATOCRIT: 35.2 % — AB (ref 39.0–52.0)
Hemoglobin: 12.1 g/dL — ABNORMAL LOW (ref 13.0–17.0)
MCH: 30.8 pg (ref 26.0–34.0)
MCHC: 34.4 g/dL (ref 30.0–36.0)
MCV: 89.6 fL (ref 78.0–100.0)
Platelets: 213 10*3/uL (ref 150–400)
RBC: 3.93 MIL/uL — ABNORMAL LOW (ref 4.22–5.81)
RDW: 12.4 % (ref 11.5–15.5)
WBC: 5.1 10*3/uL (ref 4.0–10.5)

## 2017-07-04 NOTE — Progress Notes (Signed)
Patient ID: Lucas Murray, male   DOB: 1970-09-14, 46 y.o.   MRN: [id number] 3 Days Post-Op   Subjective: No real change. No flatus or bowel movements. Abdomen felt very tight last night but better this morning. No significant pain.  Objective: Vital signs in last 24 hours: Temp:  [98.4 F (36.9 C)-98.9 F (37.2 C)] 98.4 F (36.9 C) (11/22 0506) Pulse Rate:  [92-107] 92 (11/22 0506) Resp:  [18-20] 20 (11/22 0506) BP: (124-136)/(77-90) 127/85 (11/22 0506) SpO2:  [95 %-98 %] 98 % (11/22 0506)    Intake/Output from previous day: 11/21 0701 - 11/22 0700 In: 3000 [I.V.:3000] Out: 3125 [Urine:1125; Emesis/NG output:2000] Intake/Output this shift: No intake/output data recorded.  General appearance: alert, cooperative and no distress GI: Moderately distended but soft and nontender. No significant bowel sounds. Incision/Wound: No erythema or drainage.  Lab Results:  Recent Labs    07/02/17 0505 07/04/17 0538  WBC 7.6 5.1  HGB 14.0 12.1*  HCT 41.8 35.2*  PLT 210 213   BMET Recent Labs    07/02/17 0505 07/04/17 0538  NA 141 140  K 4.2 3.6  CL 105 107  CO2 28 24  GLUCOSE 125* 101*  BUN 20 18  CREATININE 1.07 0.91  CALCIUM 8.9 8.7*     Studies/Results: Dg Abd 2 Views  Result Date: 07/04/2017 CLINICAL DATA:  Small-bowel obstruction EXAM: ABDOMEN - 2 VIEW COMPARISON:  07/01/2017 FINDINGS: NG in the proximal stomach Stomach decompressed. Multiple dilated small bowel loops containing differential air-fluid levels. Colon decompressed. Negative for pneumoperitoneum. Surgical bowel clips in the right lower quadrant. Skin staples in the midline from recent laparotomy IMPRESSION: Small bowel dilatation with differential air-fluid levels consistent with small-bowel obstruction. No free air. Electronically Signed   By: Franchot Gallo M.D.   On: 07/04/2017 07:58    Anti-infectives: Anti-infectives (From admission, onward)   Start     Dose/Rate Route Frequency Ordered Stop   07/01/17 1206  ciprofloxacin (CIPRO) 400 MG/200ML IVPB    Comments:  Waldron Session   : cabinet override      07/01/17 1206 07/01/17 1332   07/01/17 1145  ciprofloxacin (CIPRO) IVPB 400 mg     400 mg 200 mL/hr over 60 Minutes Intravenous On call to O.R. 07/01/17 1142 07/01/17 1332   07/01/17 1145  metroNIDAZOLE (FLAGYL) IVPB 500 mg     500 mg 100 mL/hr over 60 Minutes Intravenous On call to O.R. 07/01/17 1142 07/01/17 1357      Assessment/Plan: s/p Procedure(s): LAPAROSCOPY, LAPAROTOMY WITH ILEO-CECECTOMY Pathology showed external serosal adhesions. X-ray obtained last night is read as small bowel obstruction but I think in this clinical situation almost certainly this is a postoperative ileus. No evidence of infection or other complication. Continue bowel rest with NG suction and IV fluids. Discussed with patient and family.    LOS: 5 days    Edward Jolly 07/04/2017

## 2017-07-05 MED ORDER — BISACODYL 10 MG RE SUPP
10.0000 mg | Freq: Once | RECTAL | Status: AC
  Administered 2017-07-05: 10 mg via RECTAL
  Filled 2017-07-05: qty 1

## 2017-07-05 NOTE — Progress Notes (Signed)
Patient ID: Lucas Murray, male   DOB: Sep 03, 1970, 46 y.o.   MRN: [id number] 4 Days Post-Op   Subjective: Still no flatus or bowel movement.  He feels that his abdomen is less distended.  He is having no pain and has not taken any pain medication in 2 days.  Up out of bed all day and walking in the hall frequently.  Has felt a few rumblings in his abdomen  Objective: Vital signs in last 24 hours: Temp:  [98.4 F (36.9 C)-99.3 F (37.4 C)] 98.9 F (37.2 C) (11/23 0554) Pulse Rate:  [84-87] 87 (11/23 0554) Resp:  [18-20] 18 (11/23 0554) BP: (123-133)/(69-78) 133/78 (11/23 0554) SpO2:  [97 %-98 %] 98 % (11/23 0554)    Intake/Output from previous day: 11/22 0701 - 11/23 0700 In: 3230 [P.O.:30; I.V.:3000; NG/GT:200] Out: 1750 [Urine:1100; Emesis/NG output:650] Intake/Output this shift: Total I/O In: 250 [I.V.:250] Out: -   General appearance: alert, cooperative, no distress and Appears well GI: Moderately distended but without any tenderness.  I do not hear any bowel sounds. Incision/Wound: No erythema or drainage  Lab Results:  Recent Labs    07/04/17 0538  WBC 5.1  HGB 12.1*  HCT 35.2*  PLT 213   BMET Recent Labs    07/04/17 0538  NA 140  K 3.6  CL 107  CO2 24  GLUCOSE 101*  BUN 18  CREATININE 0.91  CALCIUM 8.7*     Studies/Results: Dg Abd 2 Views  Result Date: 07/04/2017 CLINICAL DATA:  Small-bowel obstruction EXAM: ABDOMEN - 2 VIEW COMPARISON:  07/01/2017 FINDINGS: NG in the proximal stomach Stomach decompressed. Multiple dilated small bowel loops containing differential air-fluid levels. Colon decompressed. Negative for pneumoperitoneum. Surgical bowel clips in the right lower quadrant. Skin staples in the midline from recent laparotomy IMPRESSION: Small bowel dilatation with differential air-fluid levels consistent with small-bowel obstruction. No free air. Electronically Signed   By: Franchot Gallo M.D.   On: 07/04/2017 07:58     Anti-infectives: Anti-infectives (From admission, onward)   Start     Dose/Rate Route Frequency Ordered Stop   07/01/17 1206  ciprofloxacin (CIPRO) 400 MG/200ML IVPB    Comments:  Waldron Session   : cabinet override      07/01/17 1206 07/01/17 1332   07/01/17 1145  ciprofloxacin (CIPRO) IVPB 400 mg     400 mg 200 mL/hr over 60 Minutes Intravenous On call to O.R. 07/01/17 1142 07/01/17 1332   07/01/17 1145  metroNIDAZOLE (FLAGYL) IVPB 500 mg     500 mg 100 mL/hr over 60 Minutes Intravenous On call to O.R. 07/01/17 1142 07/01/17 1357      Assessment/Plan: s/p Procedure(s): LAPAROSCOPY, LAPAROTOMY WITH ILEO-CECECTOMY Postop day #4 with prolonged ileus.  I do not see evidence of any other complication clinically. Continue NG and very limited liquids for comfort.  Try Dulcolax suppository today. I discussed that if there is not any progression tomorrow would consider PICC line and TNA.  He is approaching 1 week without nutrition.   LOS: 6 days    Edward Jolly 07/05/2017

## 2017-07-06 LAB — CREATININE, SERUM
Creatinine, Ser: 0.91 mg/dL (ref 0.61–1.24)
GFR calc non Af Amer: >60 mL/min (ref >60)

## 2017-07-06 MED ORDER — BISACODYL 10 MG RE SUPP
10.0000 mg | Freq: Once | RECTAL | Status: AC
  Administered 2017-07-06: 10 mg via RECTAL
  Filled 2017-07-06: qty 1

## 2017-07-06 NOTE — Progress Notes (Signed)
Patient ID: Lucas Murray, male   DOB: Sep 29, 1970, 46 y.o.   MRN: [id number] 5 Days Post-Op   Subjective: No complaints this morning.  Had flatus and a small bowel movement with the suppository yesterday and then had further flatus afterward.  Feels his abdomen is less distended.  No pain at all.  Objective: Vital signs in last 24 hours: Temp:  [97.9 F (36.6 C)-98.2 F (36.8 C)] 98.2 F (36.8 C) (11/24 0538) Pulse Rate:  [84-87] 84 (11/24 0538) Resp:  [18] 18 (11/24 0538) BP: (132-136)/(72-87) 135/87 (11/24 0538) SpO2:  [98 %] 98 % (11/24 0538)    Intake/Output from previous day: 11/23 0701 - 11/24 0700 In: 1827 [P.O.:327; I.V.:1500] Out: 2210 [Urine:1910; Emesis/NG output:300] Intake/Output this shift: No intake/output data recorded.  General appearance: alert, cooperative and no distress GI: normal findings: soft, non-tender and Now nondistended. Incision/Wound: No erythema or drainage  Lab Results:  Recent Labs    07/04/17 0538  WBC 5.1  HGB 12.1*  HCT 35.2*  PLT 213   BMET Recent Labs    07/04/17 0538  NA 140  K 3.6  CL 107  CO2 24  GLUCOSE 101*  BUN 18  CREATININE 0.91  CALCIUM 8.7*     Studies/Results: No results found.  Anti-infectives: Anti-infectives (From admission, onward)   Start     Dose/Rate Route Frequency Ordered Stop   07/01/17 1206  ciprofloxacin (CIPRO) 400 MG/200ML IVPB    Comments:  Waldron Session   : cabinet override      07/01/17 1206 07/01/17 1332   07/01/17 1145  ciprofloxacin (CIPRO) IVPB 400 mg     400 mg 200 mL/hr over 60 Minutes Intravenous On call to O.R. 07/01/17 1142 07/01/17 1332   07/01/17 1145  metroNIDAZOLE (FLAGYL) IVPB 500 mg     500 mg 100 mL/hr over 60 Minutes Intravenous On call to O.R. 07/01/17 1142 07/01/17 1357      Assessment/Plan: s/p Procedure(s): LAPAROSCOPY, LAPAROTOMY WITH ILEO-CECECTOMY Postoperative ileus which appears to be resolving clinically.  No evidence of any other complication. Try  clamping NG tube today.  Repeat Dulcolax suppository.    LOS: 7 days    Edward Jolly 07/06/2017

## 2017-07-07 NOTE — Progress Notes (Signed)
Patient ID: Lucas Murray, male   DOB: [birth year], 46 y.o.   MRN: [id number] 6 Days Post-Op   Subjective: Doing well today.  Had 2 normal bowel movements yesterday.  No nausea with NG out.  Objective: Vital signs in last 24 hours: Temp:  [97.9 F (36.6 C)-99.1 F (37.3 C)] 98.5 F (36.9 C) (11/25 0600) Pulse Rate:  [71-86] 71 (11/25 0600) Resp:  [18] 18 (11/25 0600) BP: (130-133)/(81-83) 133/82 (11/25 0600) SpO2:  [97 %-99 %] 97 % (11/25 0600) Last BM Date: 07/06/17  Intake/Output from previous day: 11/24 0701 - 11/25 0700 In: 3000 [I.V.:3000] Out: 3325 [Urine:2925; Emesis/NG output:400] Intake/Output this shift: Total I/O In: 120 [P.O.:120] Out: -   General appearance: alert, cooperative and no distress GI: normal findings: soft, non-tender and Nondistended Incision/Wound: No erythema or drainage, staples intact  Lab Results:  No results for input(s): WBC, HGB, HCT, PLT in the last 72 hours. BMET Recent Labs    07/06/17 0655  CREATININE 0.91     Studies/Results: No results found.  Anti-infectives: Anti-infectives (From admission, onward)   Start     Dose/Rate Route Frequency Ordered Stop   07/01/17 1206  ciprofloxacin (CIPRO) 400 MG/200ML IVPB    Comments:  Waldron Session   : cabinet override      07/01/17 1206 07/01/17 1332   07/01/17 1145  ciprofloxacin (CIPRO) IVPB 400 mg     400 mg 200 mL/hr over 60 Minutes Intravenous On call to O.R. 07/01/17 1142 07/01/17 1332   07/01/17 1145  metroNIDAZOLE (FLAGYL) IVPB 500 mg     500 mg 100 mL/hr over 60 Minutes Intravenous On call to O.R. 07/01/17 1142 07/01/17 1357      Assessment/Plan: s/p Procedure(s): LAPAROSCOPY, LAPAROTOMY WITH ILEO-CECECTOMY Doing well with resolved postoperative ileus Start full liquid diet.  Possibly home tomorrow if tolerating well.   LOS: 8 days    Edward Jolly 07/07/2017

## 2017-07-08 NOTE — Discharge Summary (Signed)
  [facility] Discharge Summary   Patient ID: Lucas Murray MRN: [id number] DOB/AGE: 1970/10/10 46 y.o.  Admit date: 06/29/2017 Discharge date: 07/08/2017  Admitting Diagnosis: SBO  Discharge Diagnosis Patient Active Problem List   Diagnosis Date Noted  . Small bowel obstruction ([place]) 06/29/2017    Consultants None  Imaging: No results found.  Procedures Dr. Excell Seltzer (07/01/17) - Laparoscopic assisted ileocecectomy with anastomosis  Hospital Course:  Lucas Murray is a 46yo male with no significant PMH and no prior h/o abdominal surgery, who presented to [facility] 11/17 with 24 hours of mid-periumbilical abdominal pain, nausea, and vomiting.  Workup showed SBO.  Patient was admitted and started on the small bowel protocol. His obstruction did not resolve and he was taken to the operating room for the above mentioned procedure.  Tolerated procedure well and was transferred to the floor.  He did have an ileus postoperatively, but this improved with time. Diet was slowly advanced as tolerated.  Surgical pathology returned and revealed benign small bowel tissue with mucosal erosions associated with acute inflammation. On POD7 the patient was voiding well, tolerating diet, ambulating well, pain well controlled, vital signs stable, incisions c/d/i and felt stable for discharge home.  Patient will follow up in our office within 1 week for staple removal, and then with Dr. Excell Seltzer. He knows to call with questions or concerns.   Physical Exam: Gen:  Alert, NAD HEENT: EOM's intact, pupils equal and round Card:  RRR, no M/G/R heard Pulm:  CTAB, no W/R/R, effort normal Abd: Soft, mild distension, + BS, midline incision C/D/I with staples intact and no erythema or drainage Psych: A&Ox3    Allergies as of 07/08/2017      Reactions   E-mycin [erythromycin] Anaphylaxis   Penicillins    Has patient had a PCN reaction causing immediate rash, facial/tongue/throat swelling, SOB or  lightheadedness with hypotension: No Has patient had a PCN reaction causing severe rash involving mucus membranes or skin necrosis: No Has patient had a PCN reaction that required hospitalization: No Has patient had a PCN reaction occurring within the last 10 years: No If all of the above answers are "NO", then may proceed with Cephalosporin use.      Medication List    STOP taking these medications   naproxen sodium 220 MG tablet Commonly known as:  ALEVE     TAKE these medications   acetaminophen 500 MG tablet Commonly known as:  TYLENOL Take 1,000 mg every 6 (six) hours as needed by mouth for mild pain or headache.        Follow-up Information    Excell Seltzer, MD. Go on 07/25/2017.   Specialty:  General Surgery Why:  Your appointment is 07/25/17 at 10:15AM for follow up from your surgery. Please arrive 15 minutes [place] to check in. Contact information: [address] [place] [address] [phone]        [facility], [place]. Go on 07/15/2017.   Specialty:  General Surgery Why:  Your appointment is 07/15/2017 at 2:15PM with one of our nurses to have your staples removed. Please arrive 30 minutes prior to your appointment to check in and fill out paperwork. Bring photo ID. Contact information: [address] [place] [phone]          Signed: Wellington Hampshire, [facility] 07/08/2017, 10:41 AM Pager: [phone] Consults: [phone] Mon-Fri 7:00 am-4:30 pm Sat-Sun 7:00 am-11:30 am

## 2017-07-08 NOTE — Progress Notes (Signed)
Pt alert and oriented. Ambulating and tolerating diet.  No complaints of pain.  D/C instructions given and all questions answered. Pt was d/cd home.

## 2018-02-13 IMAGING — DX DG ABDOMEN 2V
2 series · 2 of 2 positions shown · non-contrast
Comparison: 07/01/2017

CLINICAL DATA: Small-bowel obstruction

EXAM:
ABDOMEN - 2 VIEW

[abdomen erect (1 of 2)]
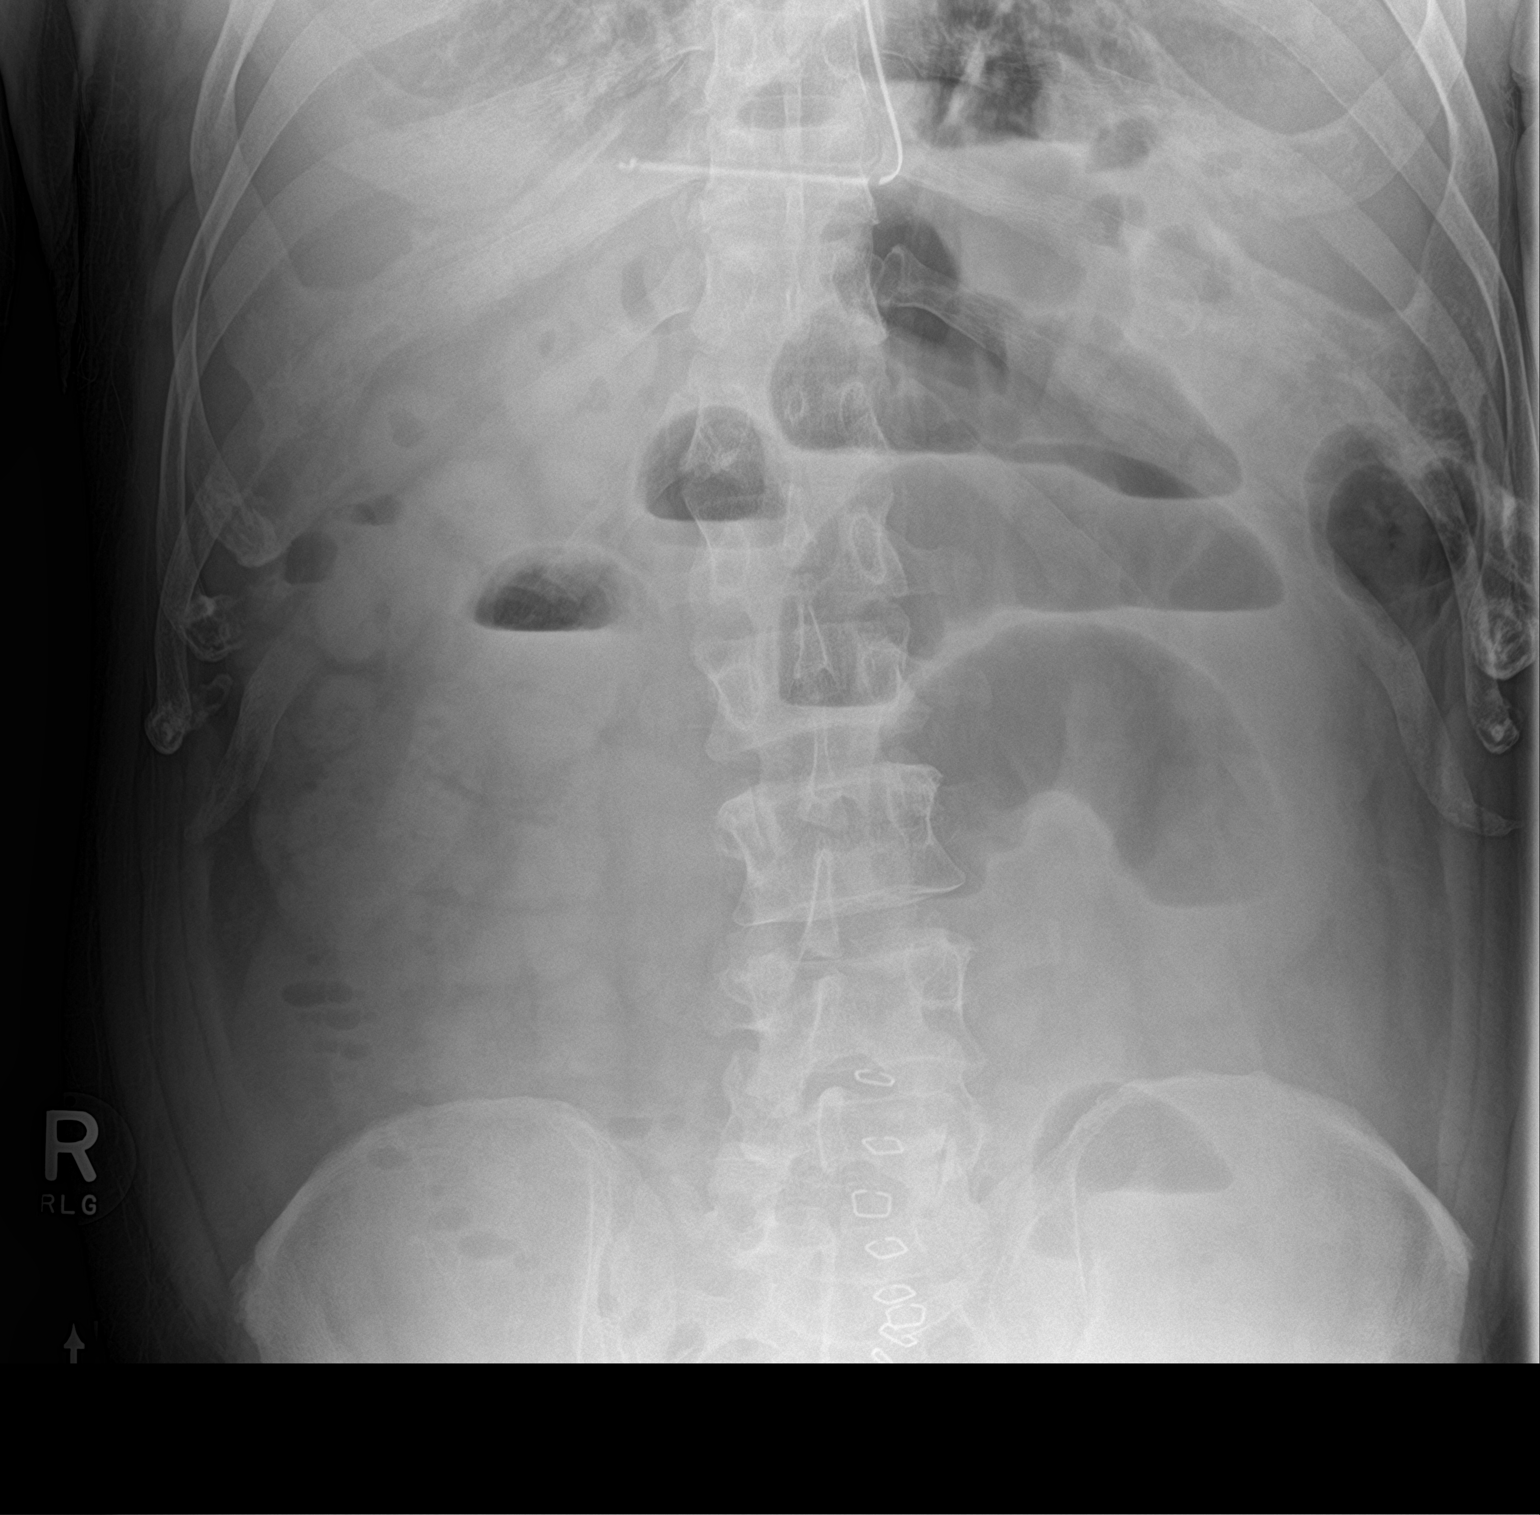

[abdomen erect (2 of 2)]
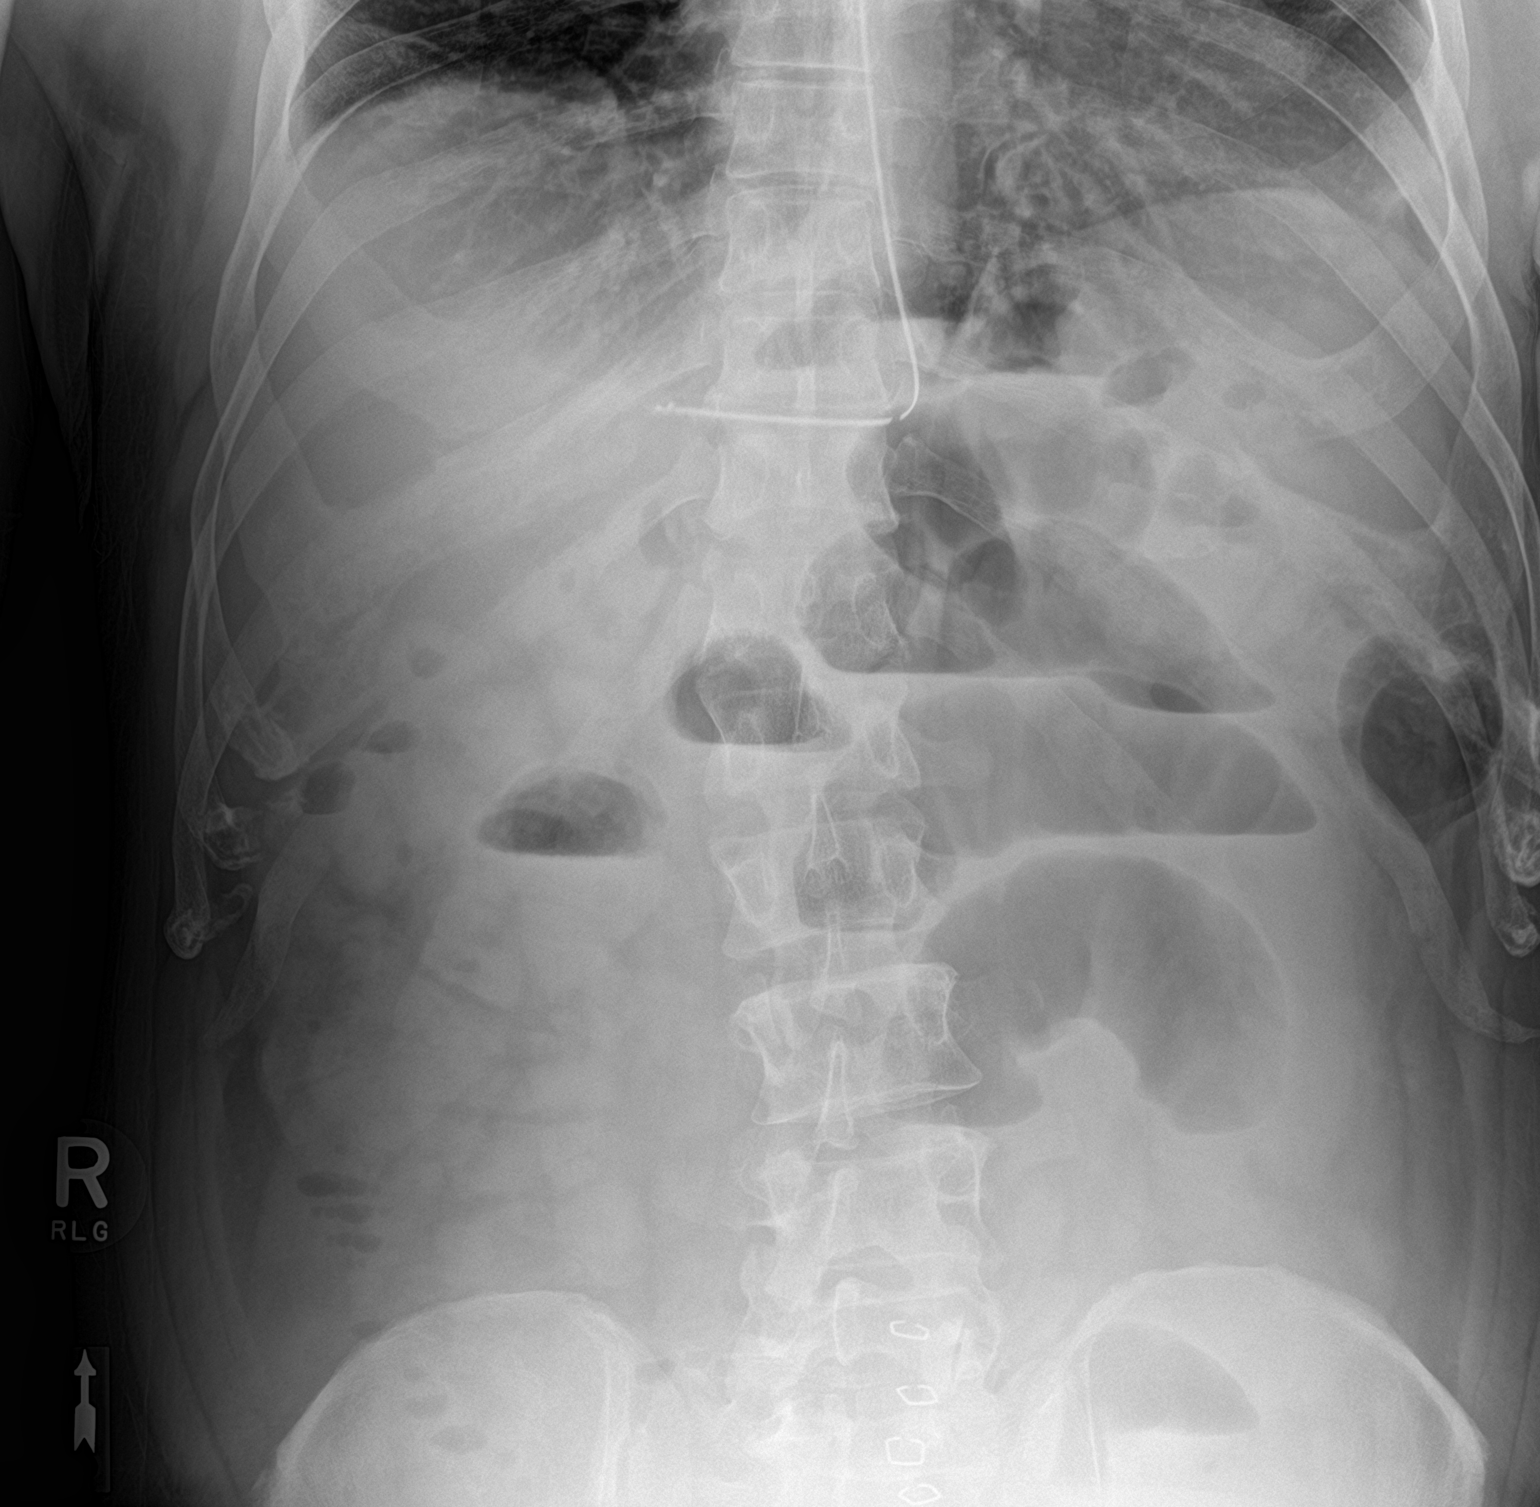

[2 of 2 positions shown; findings below may reference images not displayed]

FINDINGS: NG in the proximal stomach

Stomach decompressed.

Multiple dilated small bowel loops containing differential air-fluid
levels. Colon decompressed. Negative for pneumoperitoneum. Surgical
bowel clips in the right lower quadrant. Skin staples in the midline
from recent laparotomy
IMPRESSION: Small bowel dilatation with differential air-fluid levels consistent
with small-bowel obstruction. No free air.

## 2018-04-16 ENCOUNTER — Ambulatory Visit (INDEPENDENT_AMBULATORY_CARE_PROVIDER_SITE_OTHER): Payer: No Typology Code available for payment source | Admitting: Family Medicine

## 2018-04-16 ENCOUNTER — Encounter: Payer: Self-pay | Admitting: Family Medicine

## 2018-04-16 VITALS — BP 110/80 | HR 72 | Temp 98.1°F | Ht 72.5 in | Wt 219.4 lb

## 2018-04-16 DIAGNOSIS — Z0000 Encounter for general adult medical examination without abnormal findings: Secondary | ICD-10-CM

## 2018-04-16 DIAGNOSIS — I728 Aneurysm of other specified arteries: Secondary | ICD-10-CM

## 2018-04-16 DIAGNOSIS — R39198 Other difficulties with micturition: Secondary | ICD-10-CM | POA: Diagnosis not present

## 2018-04-16 DIAGNOSIS — Z23 Encounter for immunization: Secondary | ICD-10-CM

## 2018-04-16 LAB — BASIC METABOLIC PANEL
BUN: 12 mg/dL (ref 6–23)
CALCIUM: 10.1 mg/dL (ref 8.4–10.5)
CO2: 31 mEq/L (ref 19–32)
CREATININE: 1.18 mg/dL (ref 0.40–1.50)
Chloride: 102 mEq/L (ref 96–112)
GFR: 70.12 mL/min (ref >60.00)
Glucose, Bld: 97 mg/dL (ref 70–99)
Potassium: 4.4 mEq/L (ref 3.5–5.1)
SODIUM: 137 meq/L (ref 135–145)

## 2018-04-16 LAB — CBC WITH DIFFERENTIAL/PLATELET
BASOS PCT: 0.6 % (ref 0.0–3.0)
Basophils Absolute: 0 10*3/uL (ref 0.0–0.1)
EOS PCT: 1.9 % (ref 0.0–5.0)
Eosinophils Absolute: 0.1 10*3/uL (ref 0.0–0.7)
HEMATOCRIT: 45.5 % (ref 39.0–52.0)
Hemoglobin: 15.9 g/dL (ref 13.0–17.0)
LYMPHS PCT: 36.8 % (ref 12.0–46.0)
Lymphs Abs: 2.1 10*3/uL (ref 0.7–4.0)
MCHC: 34.9 g/dL (ref 30.0–36.0)
MCV: 88.9 fl (ref 78.0–100.0)
MONOS PCT: 7.1 % (ref 3.0–12.0)
Monocytes Absolute: 0.4 10*3/uL (ref 0.1–1.0)
NEUTROS ABS: 3 10*3/uL (ref 1.4–7.7)
Neutrophils Relative %: 53.6 % (ref 43.0–77.0)
PLATELETS: 213 10*3/uL (ref 150.0–400.0)
RBC: 5.12 Mil/uL (ref 4.22–5.81)
RDW: 12.7 % (ref 11.5–15.5)
WBC: 5.6 10*3/uL (ref 4.0–10.5)

## 2018-04-16 LAB — HEPATIC FUNCTION PANEL
ALBUMIN: 4.8 g/dL (ref 3.5–5.2)
ALT: 24 U/L (ref 0–53)
AST: 18 U/L (ref 0–37)
Alkaline Phosphatase: 48 U/L (ref 39–117)
BILIRUBIN DIRECT: 0.1 mg/dL (ref 0.0–0.3)
TOTAL PROTEIN: 7.3 g/dL (ref 6.0–8.3)
Total Bilirubin: 0.9 mg/dL (ref 0.2–1.2)

## 2018-04-16 LAB — PSA: PSA: 0.56 ng/mL (ref 0.10–4.00)

## 2018-04-16 LAB — TSH: TSH: 1.32 u[IU]/mL (ref 0.35–4.50)

## 2018-04-16 LAB — LIPID PANEL
CHOLESTEROL: 149 mg/dL (ref 0–200)
HDL: 30.2 mg/dL — AB (ref >39.00)
LDL Cholesterol: 89 mg/dL (ref 0–99)
NonHDL: 118.8
TRIGLYCERIDES: 150 mg/dL — AB (ref 0.0–149.0)
Total CHOL/HDL Ratio: 5
VLDL: 30 mg/dL (ref 0.0–40.0)

## 2018-04-16 NOTE — Progress Notes (Signed)
Subjective:     Patient ID: Lucas Murray, male   DOB: [birth year], 47 y.o.   MRN: [id number]  HPI Patient seen for physical exam. He had small bowel obstruction back in November and has done well since his surgery. No recurrent abdominal pain.  Generally fairly healthy. He had CT scan during that admission which did show question of some fatty liver changes. 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 Suggested 1 year follow-up. No family history of known aneurysm.  His tetanus is up-to-date. He takes no regular medications Never smoked. No regular alcohol use.Needs flu vaccine.  He describes some recent intermittent slow stream. No burning with urination. No fevers or chills.  Past Medical History:  Diagnosis Date  . Allergy   . Chicken pox   . GERD (gastroesophageal reflux disease)   . Urinary tract infection    Past Surgical History:  Procedure Laterality Date  . COLON RESECTION N/A 07/01/2017   Procedure: LAPAROSCOPY, LAPAROTOMY WITH ILEO-CECECTOMY;  Surgeon: Excell Seltzer, MD;  Location: [facility];  Service: General;  Laterality: N/A;  . FRENULECTOMY, LINGUAL N/A    age 30  . VASECTOMY    . [place]    reports that he has never smoked. He has never used smokeless tobacco. He reports that he drinks alcohol. He reports that he does not use drugs. family history includes Arthritis in his maternal grandmother and mother; Cancer in his maternal grandfather; Heart disease in his maternal grandfather; Hyperlipidemia in his mother; Hypertension in his maternal grandfather and mother. Allergies  Allergen Reactions  . E-Mycin [Erythromycin] Anaphylaxis  . Penicillins     Has patient had a PCN reaction causing immediate rash, facial/tongue/throat swelling, SOB or lightheadedness with hypotension: No Has patient had a PCN reaction causing severe rash involving mucus membranes or skin necrosis: No Has patient had a PCN reaction that required  hospitalization: No Has patient had a PCN reaction occurring within the last 10 years: No If all of the above answers are "NO", then may proceed with Cephalosporin use.      Review of Systems  Constitutional: Negative for activity change, appetite change, fatigue and fever.  HENT: Negative for congestion, ear pain and trouble swallowing.   Eyes: Negative for pain and visual disturbance.  Respiratory: Negative for cough, shortness of breath and wheezing.   Cardiovascular: Negative for chest pain and palpitations.  Gastrointestinal: Negative for abdominal distention, abdominal pain, blood in stool, constipation, diarrhea, nausea, rectal pain and vomiting.  Genitourinary: Negative for dysuria, hematuria and testicular pain.  Musculoskeletal: Negative for arthralgias and joint swelling.  Skin: Negative for rash.  Neurological: Negative for dizziness, syncope and headaches.  Hematological: Negative for adenopathy.  Psychiatric/Behavioral: Negative for confusion and dysphoric mood.       Objective:   Physical Exam  Constitutional: He is oriented to person, place, and time. He appears well-developed and well-nourished. No distress.  HENT:  Head: Normocephalic and atraumatic.  Right Ear: External ear normal.  Left Ear: External ear normal.  Mouth/Throat: Oropharynx is clear and moist.  Eyes: Pupils are equal, round, and reactive to light. Conjunctivae and EOM are normal.  Neck: Normal range of motion. Neck supple. No thyromegaly present.  Cardiovascular: Normal rate, regular rhythm and normal heart sounds.  No murmur heard. Pulmonary/Chest: No respiratory distress. He has no wheezes. He has no rales.  Abdominal: Soft. Bowel sounds are normal. He exhibits no distension and no mass. There is no tenderness. There is  no rebound and no guarding.  Musculoskeletal: He exhibits no edema.  Lymphadenopathy:    He has no cervical adenopathy.  Neurological: He is alert and oriented to person,  place, and time. He displays normal reflexes. No cranial nerve deficit.  Skin: No rash noted.  Psychiatric: He has a normal mood and affect.       Assessment:     Physical exam. CT scan last year revealed question of fatty liver changes and 2.3 cm splenic artery aneurysm.    Plan:     -obtain screening lab work. Will include PSA with complaints of slow stream as above. There is no family history of prostate cancer -Flu vaccine given -Will discuss with radiology regarding surveillance of his splenic artery aneurysm  Eulas Post MD [facility]

## 2018-04-21 NOTE — Addendum Note (Signed)
Addended by: Eulas Post on: 04/21/2018 10:13 PM   Modules accepted: Orders

## 2018-05-05 ENCOUNTER — Telehealth: Payer: Self-pay | Admitting: Family Medicine

## 2018-05-05 NOTE — Telephone Encounter (Signed)
Copied from [place] [id number]. Topic: Referral - Status >> May 05, 2018  3:33 PM Yvette Rack wrote: Reason for CRM: Pt states he has not heard from the surgeon's office for scheduling. Pt requests call back. Cb# [phone]

## 2018-05-05 NOTE — Telephone Encounter (Signed)
Called patient and let him know that from the referral notes have been sent to [facility] and gave him the phone number so he could contact them directly.

## 2018-06-16 ENCOUNTER — Other Ambulatory Visit: Payer: Self-pay

## 2018-06-16 DIAGNOSIS — K56609 Unspecified intestinal obstruction, unspecified as to partial versus complete obstruction: Secondary | ICD-10-CM

## 2018-06-24 ENCOUNTER — Ambulatory Visit (INDEPENDENT_AMBULATORY_CARE_PROVIDER_SITE_OTHER): Payer: No Typology Code available for payment source | Admitting: Vascular Surgery

## 2018-06-24 ENCOUNTER — Other Ambulatory Visit: Payer: Self-pay

## 2018-06-24 ENCOUNTER — Encounter: Payer: Self-pay | Admitting: Vascular Surgery

## 2018-06-24 ENCOUNTER — Ambulatory Visit (HOSPITAL_COMMUNITY)
Admission: RE | Admit: 2018-06-24 | Discharge: 2018-06-24 | Disposition: A | Discharge status: Home / Self Care | Payer: No Typology Code available for payment source | Source: Ambulatory Visit | Attending: Vascular Surgery | Admitting: Vascular Surgery

## 2018-06-24 VITALS — BP 122/85 | HR 74 | Temp 97.9°F | Resp 16 | Ht 72.0 in | Wt 214.0 lb

## 2018-06-24 DIAGNOSIS — K56609 Unspecified intestinal obstruction, unspecified as to partial versus complete obstruction: Secondary | ICD-10-CM | POA: Insufficient documentation

## 2018-06-24 DIAGNOSIS — I728 Aneurysm of other specified arteries: Secondary | ICD-10-CM | POA: Diagnosis not present

## 2018-06-24 NOTE — Progress Notes (Signed)
Patient name: Frantz Quattrone MRN: [id number] DOB: Dec 11, 1970 Sex: male  REASON FOR CONSULT: Splenic artery aneurysm  HPI: Weiland Tomich is a 47 y.o. 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  Patient was reportedly hospitalized last year in November 2018 with a bowel obstruction during which he underwent laparotomy with ileocecectomy.  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  Patient states he had no knowledge of this aneurysm.  He was notified of the aneurysm recently when he applied for life insurance.  On evaluation today he has no prior knowledge of aneurysmal disease.  He has no family history of aneurysms.  States that he has no abdominal or back pain at this time.  Denies tobacco abuse.  Past Medical History:  Diagnosis Date  . Allergy   . Chicken pox   . GERD (gastroesophageal reflux disease)   . Urinary tract infection     Past Surgical History:  Procedure Laterality Date  . COLON RESECTION N/A 07/01/2017   Procedure: LAPAROSCOPY, LAPAROTOMY WITH ILEO-CECECTOMY;  Surgeon: Excell Seltzer, MD;  Location: [facility];  Service: General;  Laterality: N/A;  . FRENULECTOMY, LINGUAL N/A    age 10  . VASECTOMY    . WISDOM TOOTH EXTRACTION  1990    Family History  Problem Relation Age of Onset  . Arthritis Mother   . Hyperlipidemia Mother   . Hypertension Mother   . Arthritis Maternal Grandmother   . Heart disease Maternal Grandfather   . Cancer Maternal Grandfather        lung  . Hypertension Maternal Grandfather     SOCIAL HISTORY: Social History   Socioeconomic History  . Marital status: Married    Spouse name: Not on file  . Number of children: Not on file  . Years of education: Not on file  . Highest education level: Not on file  Occupational History  . Not on file  Social Needs  . Financial resource strain: Not on file  . Food insecurity:     Worry: Not on file    Inability: Not on file  . Transportation needs:    Medical: Not on file    Non-medical: Not on file  Tobacco Use  . Smoking status: Never Smoker  . Smokeless tobacco: Never Used  Substance and Sexual Activity  . Alcohol use: Yes    Comment: occasional   . Drug use: No  . Sexual activity: Not on file  Lifestyle  . Physical activity:    Days per week: Not on file    Minutes per session: Not on file  . Stress: Not on file  Relationships  . Social connections:    Talks on phone: Not on file    Gets together: Not on file    Attends religious service: Not on file    Active member of club or organization: Not on file    Attends meetings of clubs or organizations: Not on file    Relationship status: Not on file  . Intimate partner violence:    Fear of current or ex partner: Not on file    Emotionally abused: Not on file    Physically abused: Not on file    Forced sexual activity: Not on file  Other Topics Concern  . Not on file  Social History Narrative  . Not on file    Allergies  Allergen Reactions  .  E-Mycin [Erythromycin] Anaphylaxis  . Penicillins     Has patient had a PCN reaction causing immediate rash, facial/tongue/throat swelling, SOB or lightheadedness with hypotension: No Has patient had a PCN reaction causing severe rash involving mucus membranes or skin necrosis: No Has patient had a PCN reaction that required hospitalization: No Has patient had a PCN reaction occurring within the last 10 years: No If all of the above answers are "NO", then may proceed with Cephalosporin use.     No current outpatient medications on file.   No current facility-administered medications for this visit.     REVIEW OF SYSTEMS:  [X]  denotes positive finding, [ ]  denotes negative finding Cardiac  Comments:  Chest pain or chest pressure:    Shortness of breath upon exertion:    Short of breath when lying flat:    Irregular heart rhythm:          Vascular    Pain in calf, thigh, or hip brought on by ambulation:    Pain in feet at night that wakes you up from your sleep:     Blood clot in your veins:    Leg swelling:         Pulmonary    Oxygen at home:    Productive cough:     Wheezing:         Neurologic    Sudden weakness in arms or legs:     Sudden numbness in arms or legs:     Sudden onset of difficulty speaking or slurred speech:    Temporary loss of vision in one eye:     Problems with dizziness:         Gastrointestinal    Blood in stool:     Vomited blood:         Genitourinary    Burning when urinating:     Blood in urine:        Psychiatric    Major depression:         Hematologic    Bleeding problems:    Problems with blood clotting too easily:        Skin    Rashes or ulcers:        Constitutional    Fever or chills:      PHYSICAL EXAM: Vitals:   06/24/18 0846  BP: 122/85  Pulse: 74  Resp: 16  Temp: 97.9 F (36.6 C)  TempSrc: Oral  SpO2: 98%  Weight: 97.1 kg  Height: 6' (1.829 m)    GENERAL: The patient is a well-nourished male, in no acute distress. The vital signs are documented above. CARDIAC: There is a regular rate and rhythm.  VASCULAR:  2+ radial pulse palpable bilateral upper extremities 2+ palpable femoral pulse bilateral groins 2+ palpable DP bilateral lower extremities PULMONARY: There is good air exchange bilaterally without wheezing or rales. ABDOMEN: Soft and non-tender with normal pitched bowel sounds.  No rebound or guarding.  Well healed midline incision. MUSCULOSKELETAL: There are no major deformities or cyanosis. NEUROLOGIC: No focal weakness or paresthesias are detected. SKIN: There are no ulcers or rashes noted. PSYCHIATRIC: The patient has a normal affect.  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  Assessment/Plan:  Discussed the nature of splenic artery aneurysms with Mr. Vanderberg in clinic today.  Discussed that if he  were male or planning pregnancy we are much more aggressive about intervening.  Given the fact that this was an incidental finding and he remains asymptomatic I suspect this aneurysm has been present for some time especially given the calcification.  We did obtain a mesenteric duplex today but had some difficulty visualizing his aneurysm.  As a result, I have recommended we get a repeat CT abdomen and pelvis to ensure that there has been no interval change.  Assuming his aneurysm is unchanged I think we can continue yearly surveillance with mesenteric duplex given he only has one kidney.  If there is a growth of the aneurysm we discussed options for intervention that may involve coil embolization and/or splenectomy.  Given the location of his aneurysm I do not think he is a candidate for covered stent.  I will contact him with results once CT done.  If over 3 cm would likely recommend repair and/or intervention.   Marty Heck, MD [facility] Office: [phone] Pager: [place]

## 2018-06-27 ENCOUNTER — Other Ambulatory Visit: Payer: No Typology Code available for payment source

## 2018-06-30 ENCOUNTER — Other Ambulatory Visit: Payer: No Typology Code available for payment source

## 2018-07-01 ENCOUNTER — Other Ambulatory Visit: Payer: Self-pay

## 2018-07-01 ENCOUNTER — Telehealth: Payer: Self-pay | Admitting: Vascular Surgery

## 2018-07-01 ENCOUNTER — Other Ambulatory Visit: Payer: Self-pay | Admitting: *Deleted

## 2018-07-01 DIAGNOSIS — I728 Aneurysm of other specified arteries: Secondary | ICD-10-CM

## 2018-07-01 DIAGNOSIS — R1907 Generalized intra-abdominal and pelvic swelling, mass and lump: Secondary | ICD-10-CM

## 2018-07-01 NOTE — Telephone Encounter (Signed)
47 year old male that was seen in clinic last week for incidental splenic artery aneurysm that was discovered last year.  We had trouble visualizing the aneurysm on mesenteric duplex and decided to obtain a repeat CT just to ensure there had been no interval growth.  I reviewed the CT and agree with the radiologist that there has been no interval change over the last year.  We will plan to repeat a mesenteric duplex in 1 year given that this was incidental, he remains asymptomatic, and there is been no interval change.  I left a message with Mr. Stellmach and will try calling again later this week to see if he has any questions.  Marty Heck, MD [facility] Office: [phone] Pager: [place]

## 2019-05-21 ENCOUNTER — Encounter: Payer: Self-pay | Admitting: Family Medicine

## 2019-05-21 ENCOUNTER — Other Ambulatory Visit: Payer: Self-pay

## 2019-05-21 ENCOUNTER — Telehealth (INDEPENDENT_AMBULATORY_CARE_PROVIDER_SITE_OTHER): Payer: No Typology Code available for payment source | Admitting: Family Medicine

## 2019-05-21 DIAGNOSIS — R21 Rash and other nonspecific skin eruption: Secondary | ICD-10-CM

## 2019-05-21 NOTE — Patient Instructions (Signed)
I hope you are feeling better soon! Try an antihistamine and possibly a topical hydrocortisone or benadryl cream if itchy. Monitor the areas. Consider the COVID19 test as we discussed. Seek care promptly if your symptoms worsen, new concerns arise or you are not improving with treatment.

## 2019-05-21 NOTE — Progress Notes (Signed)
Virtual Visit via Video Note  I connected with Lucas Murray  on 05/21/19 at 12:00 PM EDT by a video enabled telemedicine application and verified that I am speaking with the correct person using two identifiers.  Location patient: home Location provider:work or home office Persons participating in the virtual visit: patient, provider  I discussed the limitations of evaluation and management by telemedicine and the availability of in person appointments. The patient expressed understanding and agreed to proceed.   HPI:  Acute visit for a rash: -4 days ago noticed 4 red painful spots on his forearm, then one lesion on R wrist -did some yardwork before the latest lesion appear -no pets in the home, no tick bites -no fevers, change in cough, fatigue, SOB, wheezing, stomach issues - other then his normal ragweed issues, mild cough with this -he is concerned about COVID  ROS: See pertinent positives and negatives per HPI.  Past Medical History:  Diagnosis Date  . Allergy   . Chicken pox   . GERD (gastroesophageal reflux disease)   . Urinary tract infection     Past Surgical History:  Procedure Laterality Date  . COLON RESECTION N/A 07/01/2017   Procedure: LAPAROSCOPY, LAPAROTOMY WITH ILEO-CECECTOMY;  Surgeon: Excell Seltzer, MD;  Location: [facility];  Service: General;  Laterality: N/A;  . FRENULECTOMY, LINGUAL N/A    age 1  . VASECTOMY    . WISDOM TOOTH EXTRACTION  1990    Family History  Problem Relation Age of Onset  . Arthritis Mother   . Hyperlipidemia Mother   . Hypertension Mother   . Arthritis Maternal Grandmother   . Heart disease Maternal Grandfather   . Cancer Maternal Grandfather        lung  . Hypertension Maternal Grandfather     SOCIAL HX: see hpi  No current outpatient medications on file.  EXAM:  VITALS per patient if applicable: denies fever  GENERAL: alert, oriented, appears well and in no acute distress  HEENT: atraumatic, conjunttiva clear, no  obvious abnormalities on inspection of external nose and ears  NECK: normal movements of the head and neck  LUNGS: on inspection no signs of respiratory distress, breathing rate appears normal, no obvious gross SOB, gasping or wheezing  CV: no obvious cyanosis  SKIN: 4 small erythematous mildly eroded areas of skin approx 4-86mm in diameter on the L forearm with small ring of skin blanching around each lesion, one excoriated erythematous papule of similar size on the R wrist  MS: moves all visible extremities without noticeable abnormality  PSYCH/NEURO: pleasant and cooperative, no obvious depression or anxiety, speech and thought processing grossly intact  ASSESSMENT AND PLAN:  Discussed the following assessment and plan:  Skin rash  -we discussed possible serious and likely etiologies, options for evaluation and workup, limitations of telemedicine visit vs in person visit, treatment, treatment risks and precautions. Pt prefers to treat via telemedicine empirically rather then risking or undertaking an in person visit at this moment. To me, these look like they may be insect bites. Advised monitoring, could try antihistamine and topical benadryl or hxtz cream with re-evaluation if worsening or other concerns. Discussed options for COVID19 testing per his concerns, though feel is unlikely. He is going to perhaps go to [facility] for testing if he decides to do so. Patient agrees to seek prompt in person care if worsening, new symptoms arise, or if is not improving with treatment.   I discussed the assessment and treatment plan with the patient. The patient  was provided an opportunity to ask questions and all were answered. The patient agreed with the plan and demonstrated an understanding of the instructions.   The patient was advised to call back or seek an in-person evaluation if the symptoms worsen or if the condition fails to improve as anticipated.   Lucretia Kern, DO   Patient Instructions   I hope you are feeling better soon! Try an antihistamine and possibly a topical hydrocortisone or benadryl cream if itchy. Monitor the areas. Consider the COVID19 test as we discussed. Seek care promptly if your symptoms worsen, new concerns arise or you are not improving with treatment.

## 2019-06-16 ENCOUNTER — Other Ambulatory Visit: Payer: Self-pay

## 2019-06-16 DIAGNOSIS — Z20822 Contact with and (suspected) exposure to covid-19: Secondary | ICD-10-CM

## 2019-06-17 ENCOUNTER — Telehealth: Payer: Self-pay | Admitting: General Practice

## 2019-06-17 LAB — NOVEL CORONAVIRUS, NAA: SARS-CoV-2, NAA: NOT DETECTED

## 2019-06-17 NOTE — Telephone Encounter (Signed)
Negative COVID results given. Patient results "NOT Detected." Caller expressed understanding. ° °

## 2019-07-27 ENCOUNTER — Other Ambulatory Visit: Payer: Self-pay

## 2019-07-27 DIAGNOSIS — I728 Aneurysm of other specified arteries: Secondary | ICD-10-CM

## 2019-07-28 ENCOUNTER — Ambulatory Visit (HOSPITAL_COMMUNITY)
Admission: RE | Admit: 2019-07-28 | Discharge: 2019-07-28 | Disposition: A | Discharge status: Home / Self Care | Payer: No Typology Code available for payment source | Source: Ambulatory Visit | Attending: Vascular Surgery | Admitting: Vascular Surgery

## 2019-07-28 ENCOUNTER — Encounter: Payer: Self-pay | Admitting: Vascular Surgery

## 2019-07-28 ENCOUNTER — Other Ambulatory Visit: Payer: Self-pay

## 2019-07-28 ENCOUNTER — Ambulatory Visit (INDEPENDENT_AMBULATORY_CARE_PROVIDER_SITE_OTHER): Payer: No Typology Code available for payment source | Admitting: Vascular Surgery

## 2019-07-28 VITALS — BP 132/81 | HR 64 | Temp 97.6°F | Resp 16 | Ht 72.0 in | Wt 215.0 lb

## 2019-07-28 DIAGNOSIS — I728 Aneurysm of other specified arteries: Secondary | ICD-10-CM | POA: Diagnosis not present

## 2019-07-28 NOTE — Progress Notes (Signed)
Patient name: Lucas Murray MRN: [id number] DOB: December 02, 1970 Sex: male  REASON FOR CONSULT: Follow-up for splenic artery aneurysm  HPI: Lucas Murray is a 48 y.o. male, with history of bowel obstruction that presents for one year follow-up of an incidentally discovered splenic artery aneurysm.  Patient was reportedly hospitalized in November 2018 with a bowel obstruction during which he underwent laparotomy with ileocecectomy.  On the CT there was an incidental discovery of a splenic artery aneurysm.  Patient states he had no knowledge of this aneurysm.  He was notified of the aneurysm when he applied for life insurance.    Reports no changes over the last year.  No significant abdominal pain.  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  Past Medical History:  Diagnosis Date  . Allergy   . Chicken pox   . GERD (gastroesophageal reflux disease)   . Urinary tract infection     Past Surgical History:  Procedure Laterality Date  . COLON RESECTION N/A 07/01/2017   Procedure: LAPAROSCOPY, LAPAROTOMY WITH ILEO-CECECTOMY;  Surgeon: Excell Seltzer, MD;  Location: [facility];  Service: General;  Laterality: N/A;  . FRENULECTOMY, LINGUAL N/A    age 33  . VASECTOMY    . WISDOM TOOTH EXTRACTION  1990    Family History  Problem Relation Age of Onset  . Arthritis Mother   . Hyperlipidemia Mother   . Hypertension Mother   . Arthritis Maternal Grandmother   . Heart disease Maternal Grandfather   . Cancer Maternal Grandfather        lung  . Hypertension Maternal Grandfather     SOCIAL HISTORY: Social History   Socioeconomic History  . Marital status: Married    Spouse name: Not on file  . Number of children: Not on file  . Years of education: Not on file  . Highest education level: Not on file  Occupational History  . Not on file  Tobacco Use  . Smoking status: Never Smoker  .  Smokeless tobacco: Never Used  Substance and Sexual Activity  . Alcohol use: Yes    Comment: occasional   . Drug use: No  . Sexual activity: Not on file  Other Topics Concern  . Not on file  Social History Narrative  . Not on file   Social Determinants of Health   Financial Resource Strain:   . Difficulty of Paying Living Expenses: Not on file  Food Insecurity:   . Worried About Charity fundraiser in the Last Year: Not on file  . Ran Out of Food in the Last Year: Not on file  Transportation Needs:   . Lack of Transportation (Medical): Not on file  . Lack of Transportation (Non-Medical): Not on file  Physical Activity:   . Days of Exercise per Week: Not on file  . Minutes of Exercise per Session: Not on file  Stress:   . Feeling of Stress : Not on file  Social Connections:   . Frequency of Communication with Friends and Family: Not on file  . Frequency of Social Gatherings with Friends and Family: Not on file  . Attends Religious Services: Not on file  . Active Member of Clubs or Organizations: Not on file  . Attends Archivist Meetings: Not on file  . Marital Status: Not on file  Intimate Partner Violence:   . Fear of Current or  Ex-Partner: Not on file  . Emotionally Abused: Not on file  . Physically Abused: Not on file  . Sexually Abused: Not on file    Allergies  Allergen Reactions  . E-Mycin [Erythromycin] Anaphylaxis  . Penicillins     Has patient had a PCN reaction causing immediate rash, facial/tongue/throat swelling, SOB or lightheadedness with hypotension: No Has patient had a PCN reaction causing severe rash involving mucus membranes or skin necrosis: No Has patient had a PCN reaction that required hospitalization: No Has patient had a PCN reaction occurring within the last 10 years: No If all of the above answers are "NO", then may proceed with Cephalosporin use.     No current outpatient medications on file.   No current  facility-administered medications for this visit.    REVIEW OF SYSTEMS:  [X]  denotes positive finding, [ ]  denotes negative finding Cardiac  Comments:  Chest pain or chest pressure:    Shortness of breath upon exertion:    Short of breath when lying flat:    Irregular heart rhythm:        Vascular    Pain in calf, thigh, or hip brought on by ambulation:    Pain in feet at night that wakes you up from your sleep:     Blood clot in your veins:    Leg swelling:         Pulmonary    Oxygen at home:    Productive cough:     Wheezing:         Neurologic    Sudden weakness in arms or legs:     Sudden numbness in arms or legs:     Sudden onset of difficulty speaking or slurred speech:    Temporary loss of vision in one eye:     Problems with dizziness:         Gastrointestinal    Blood in stool:     Vomited blood:         Genitourinary    Burning when urinating:     Blood in urine:        Psychiatric    Major depression:         Hematologic    Bleeding problems:    Problems with blood clotting too easily:        Skin    Rashes or ulcers:        Constitutional    Fever or chills:      PHYSICAL EXAM: Vitals:   07/28/19 0928  BP: 132/81  Pulse: 64  Resp: 16  Temp: 97.6 F (36.4 C)  TempSrc: Temporal  SpO2: 100%  Weight: 215 lb (97.5 kg)  Height: 6' (1.829 m)    GENERAL: The patient is a well-nourished male, in no acute distress. The vital signs are documented above. CARDIAC: There is a regular rate and rhythm.  VASCULAR:  2+ radial pulse palpable bilateral upper extremities 2+ palpable femoral pulse bilateral groins PULMONARY: There is good air exchange bilaterally without wheezing or rales. ABDOMEN: Soft and non-tender with normal pitched bowel sounds.  No rebound or guarding.  Well healed midline incision.  No left upper quadrant pain.   DATA:    CT 07/01/2018: Stable 2.2 cm splenic artery aneurysm  Mesenteric duplex today: Stable 2.2 cm splenic  artery aneurysm    Assessment/Plan:  48 year old male being followed for incidental splenic artery aneurysm.  This appears stable on mesenteric duplex today at 2.2 cm.  No growth over the  last year.  No indication for surgical intervention.  Discussed that if he were male or planning pregnancy we would be much more aggressive about intervention.  Given the fact that this was an incidental finding and he remains asymptomatic, I suspect this aneurysm has been present for some time especially given the calcification.  I will plan to see him again in 1 year with another mesenteric duplex.  Marty Heck, MD [facility] Office: [phone] Pager: [place]

## 2019-07-29 ENCOUNTER — Other Ambulatory Visit: Payer: Self-pay | Admitting: *Deleted

## 2019-07-29 DIAGNOSIS — I728 Aneurysm of other specified arteries: Secondary | ICD-10-CM

## 2020-01-20 ENCOUNTER — Other Ambulatory Visit: Payer: Self-pay

## 2020-01-20 ENCOUNTER — Ambulatory Visit (INDEPENDENT_AMBULATORY_CARE_PROVIDER_SITE_OTHER): Payer: No Typology Code available for payment source | Admitting: Family Medicine

## 2020-01-20 ENCOUNTER — Ambulatory Visit: Payer: Self-pay

## 2020-01-20 ENCOUNTER — Encounter: Payer: Self-pay | Admitting: Family Medicine

## 2020-01-20 VITALS — BP 110/90 | HR 74 | Ht 72.0 in | Wt 222.0 lb

## 2020-01-20 DIAGNOSIS — M84374A Stress fracture, right foot, initial encounter for fracture: Secondary | ICD-10-CM

## 2020-01-20 DIAGNOSIS — M84376A Stress fracture, unspecified foot, initial encounter for fracture: Secondary | ICD-10-CM | POA: Insufficient documentation

## 2020-01-20 DIAGNOSIS — M79671 Pain in right foot: Secondary | ICD-10-CM | POA: Diagnosis not present

## 2020-01-20 MED ORDER — VITAMIN D (ERGOCALCIFEROL) 1.25 MG (50000 UNIT) PO CAPS: 50000.0000 [IU] | ORAL_CAPSULE | ORAL | 0 refills | Status: DC

## 2020-01-20 NOTE — Patient Instructions (Addendum)
Good to see you Good rigid soul shoes Exercise 3 times a week in a week Never go barefoot Once weekly vitamin D K2 200 mcg daily See me again in 3 weeks

## 2020-01-20 NOTE — Assessment & Plan Note (Signed)
Appears to be a calcaneal stress fracture that is likely contributing to some of the discomfort.  This is why patient is having more pain with activity than just before steps in the morning I would be more consistent with the plantar fasciitis.  Once weekly vitamin D given, icing regimen, with discussed proper shoes, discussed icing regimen, patient will increase activity slowly over the course the next several days.  Follow-up again in 4 to 8 weeks we will ultrasound again in follow-up

## 2020-01-20 NOTE — Progress Notes (Signed)
[place] [address] [place] Phone: [phone] Subjective:   I Lucas Murray am serving as a Education administrator for Dr. Hulan Saas.  This visit occurred during the SARS-CoV-2 public health emergency.  Safety protocols were in place, including screening questions prior to the visit, additional usage of staff PPE, and extensive cleaning of exam room while observing appropriate contact time as indicated for disinfecting solutions.   I'm seeing this patient by the request  of:  Eulas Post, MD  CC: Heel pain  [id number]  Lucas Murray is a 49 y.o. male coming in with complaint of right foot pain. Patient states that his foot is painful first thing in the morning. Patient is trying to avoid weight bearing on the heel.    Onset- 6 weeks  Location - left heel pain  Duration-  Character- sore Aggravating factors- weight bearing  Reliving factors-  Therapies tried-  Severity- 5/10 at its worse      Past Medical History:  Diagnosis Date  . Allergy   . Chicken pox   . GERD (gastroesophageal reflux disease)   . Urinary tract infection    Past Surgical History:  Procedure Laterality Date  . COLON RESECTION N/A 07/01/2017   Procedure: LAPAROSCOPY, LAPAROTOMY WITH ILEO-CECECTOMY;  Surgeon: Excell Seltzer, MD;  Location: [facility];  Service: General;  Laterality: N/A;  . FRENULECTOMY, LINGUAL N/A    age 43  . VASECTOMY    . [place] EXTRACTION  1990   Social History   Socioeconomic History  . Marital status: Married    Spouse name: Not on file  . Number of children: Not on file  . Years of education: Not on file  . Highest education level: Not on file  Occupational History  . Not on file  Tobacco Use  . Smoking status: Never Smoker  . Smokeless tobacco: Never Used  Substance and Sexual Activity  . Alcohol use: Yes    Comment: occasional   . Drug use: No  . Sexual activity: Not on file  Other Topics Concern  . Not  on file  Social History Narrative  . Not on file   Social Determinants of Health   Financial Resource Strain:   . Difficulty of Paying Living Expenses:   Food Insecurity:   . Worried About Charity fundraiser in the Last Year:   . Arboriculturist in the Last Year:   Transportation Needs:   . Film/video editor (Medical):   Marland Kitchen Lack of Transportation (Non-Medical):   Physical Activity:   . Days of Exercise per Week:   . Minutes of Exercise per Session:   Stress:   . Feeling of Stress :   Social Connections:   . Frequency of Communication with Friends and Family:   . Frequency of Social Gatherings with Friends and Family:   . Attends Religious Services:   . Active Member of Clubs or Organizations:   . Attends Archivist Meetings:   Marland Kitchen Marital Status:    Allergies  Allergen Reactions  . E-Mycin [Erythromycin] Anaphylaxis  . Penicillins     Has patient had a PCN reaction causing immediate rash, facial/tongue/throat swelling, SOB or lightheadedness with hypotension: No Has patient had a PCN reaction causing severe rash involving mucus membranes or skin necrosis: No Has patient had a PCN reaction that required hospitalization: No Has patient had a PCN reaction occurring within the last 10 years: No If all of  the above answers are "NO", then may proceed with Cephalosporin use.    Family History  Problem Relation Age of Onset  . Arthritis Mother   . Hyperlipidemia Mother   . Hypertension Mother   . Arthritis Maternal Grandmother   . Heart disease Maternal Grandfather   . Cancer Maternal Grandfather        lung  . Hypertension Maternal Grandfather          Current Outpatient Medications (Other):  Marland Kitchen  Vitamin D, Ergocalciferol, (DRISDOL) 1.25 MG (50000 UNIT) CAPS capsule, Take 1 capsule (50,000 Units total) by mouth every 7 (seven) days.   Reviewed prior external information including notes and imaging from  primary care provider As well as notes that were  available from care everywhere and other healthcare systems.  Past medical history, social, surgical and family history all reviewed in electronic medical record.  No pertanent information unless stated regarding to the chief complaint.   Review of Systems:  No headache, visual changes, nausea, vomiting, diarrhea, constipation, dizziness, abdominal pain, skin rash, fevers, chills, night sweats, weight loss, swollen lymph nodes, body aches, joint swelling, chest pain, shortness of breath, mood changes. POSITIVE muscle aches  Objective  Blood pressure 110/90, pulse 74, height 6' (1.829 m), weight 222 lb (100.7 kg), SpO2 97 %.   General: No apparent distress alert and oriented x3 mood and affect normal, dressed appropriately.  HEENT: Pupils equal, extraocular movements intact  Respiratory: Patient's speak in full sentences and does not appear short of breath  Cardiovascular: No lower extremity edema, non tender, no erythema  Neuro: Cranial nerves II through XII are intact, neurovascularly intact in all extremities with 2+ DTRs and 2+ pulses.  Gait normal with good balance and coordination.  MSK:  Non tender with full range of motion and good stability and symmetric strength and tone of shoulders, elbows, wrist, hip, knee and ankles bilaterally.  Right heel shows the patient does have some tenderness to palpation of the plantar aspect.  Patient does have some wide splaying between the first and second glide breakdown the transverse arch. Limited musculoskeletal ultrasound was performed and interpreted Lyndal Pulley  Limited ultrasound of patient's right heel shows the patient does have a cortical irregularity of the calcaneal bone near the insertion of the plantar fascia.  Plantar fascia mildly enlarged at 0.39 cm. 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 Any transcriptional errors that result from this process are unintentional.

## 2020-02-10 ENCOUNTER — Ambulatory Visit (INDEPENDENT_AMBULATORY_CARE_PROVIDER_SITE_OTHER): Payer: No Typology Code available for payment source | Admitting: Family Medicine

## 2020-02-10 ENCOUNTER — Other Ambulatory Visit: Payer: Self-pay

## 2020-02-10 ENCOUNTER — Encounter: Payer: Self-pay | Admitting: Family Medicine

## 2020-02-10 DIAGNOSIS — M84374D Stress fracture, right foot, subsequent encounter for fracture with routine healing: Secondary | ICD-10-CM

## 2020-02-10 NOTE — Patient Instructions (Addendum)
  Wear brace with long walks Continue OOFOS in house Should resolve in 6-8 weeks If still in pain see me in 6-8 weeks

## 2020-02-10 NOTE — Assessment & Plan Note (Signed)
Patient is doing much better overall.  Patient is about 80 to 85% better.  I anticipate patient continuing to improve.  Patient can discontinue the once weekly vitamin D at this moment but may need to repeat if starting to have increasing discomfort again.  Continue the brace at this time.  Follow-up again in 4 to 8 weeks

## 2020-02-10 NOTE — Progress Notes (Signed)
[facility] [address] [place] Phone: [phone] Subjective:   I [name] am serving as a Education administrator for Dr. Hulan Saas.  This visit occurred during the SARS-CoV-2 public health emergency.  Safety protocols were in place, including screening questions prior to the visit, additional usage of staff PPE, and extensive cleaning of exam room while observing appropriate contact time as indicated for disinfecting solutions.   I'm seeing this patient by the request  of:  Eulas Post, MD  CC: Heel pain follow-up  [id number]   01/20/2020 Appears to be a calcaneal stress fracture that is likely contributing to some of the discomfort.  This is why patient is having more pain with activity than just before steps in the morning I would be more consistent with the plantar fasciitis.  Once weekly vitamin D given, icing regimen, with discussed proper shoes, discussed icing regimen, patient will increase activity slowly over the course the next several days.  Follow-up again in 4 to 8 weeks we will ultrasound again in follow-up  Update 02/10/2020 Fox Salminen is a 49 y.o. male coming in with complaint of right heel pain. Patient states he is doing better. Plantar fascia is gone for the most part. Walked yesterday and states his foot is a little sore.  Patient does state about 85% better.  Patient did go for a long walk without The pneumatic brace and did notice more in discomfort.      Past Medical History:  Diagnosis Date  . Allergy   . Chicken pox   . GERD (gastroesophageal reflux disease)   . Urinary tract infection    Past Surgical History:  Procedure Laterality Date  . COLON RESECTION N/A 07/01/2017   Procedure: LAPAROSCOPY, LAPAROTOMY WITH ILEO-CECECTOMY;  Surgeon: Excell Seltzer, MD;  Location: [facility];  Service: General;  Laterality: N/A;  . FRENULECTOMY, LINGUAL N/A    age 81  . VASECTOMY    . [place] EXTRACTION  1990     Social History   Socioeconomic History  . Marital status: Married    Spouse name: Not on file  . Number of children: Not on file  . Years of education: Not on file  . Highest education level: Not on file  Occupational History  . Not on file  Tobacco Use  . Smoking status: Never Smoker  . Smokeless tobacco: Never Used  Vaping Use  . Vaping Use: Never used  Substance and Sexual Activity  . Alcohol use: Yes    Comment: occasional   . Drug use: No  . Sexual activity: Not on file  Other Topics Concern  . Not on file  Social History Narrative  . Not on file   Social Determinants of Health   Financial Resource Strain:   . Difficulty of Paying Living Expenses:   Food Insecurity:   . Worried About Charity fundraiser in the Last Year:   . Arboriculturist in the Last Year:   Transportation Needs:   . Film/video editor (Medical):   Marland Kitchen Lack of Transportation (Non-Medical):   Physical Activity:   . Days of Exercise per Week:   . Minutes of Exercise per Session:   Stress:   . Feeling of Stress :   Social Connections:   . Frequency of Communication with Friends and Family:   . Frequency of Social Gatherings with Friends and Family:   . Attends Religious Services:   . Active Member of  Clubs or Organizations:   . Attends Archivist Meetings:   Marland Kitchen Marital Status:    Allergies  Allergen Reactions  . E-Mycin [Erythromycin] Anaphylaxis  . Penicillins     Has patient had a PCN reaction causing immediate rash, facial/tongue/throat swelling, SOB or lightheadedness with hypotension: No Has patient had a PCN reaction causing severe rash involving mucus membranes or skin necrosis: No Has patient had a PCN reaction that required hospitalization: No Has patient had a PCN reaction occurring within the last 10 years: No If all of the above answers are "NO", then may proceed with Cephalosporin use.    Family History  Problem Relation Age of Onset  . Arthritis Mother   .  Hyperlipidemia Mother   . Hypertension Mother   . Arthritis Maternal Grandmother   . Heart disease Maternal Grandfather   . Cancer Maternal Grandfather        lung  . Hypertension Maternal Grandfather          Current Outpatient Medications (Other):  Marland Kitchen  Vitamin D, Ergocalciferol, (DRISDOL) 1.25 MG (50000 UNIT) CAPS capsule, Take 1 capsule (50,000 Units total) by mouth every 7 (seven) days.   Reviewed prior external information including notes and imaging from  primary care provider As well as notes that were available from care everywhere and other healthcare systems.  Past medical history, social, surgical and family history all reviewed in electronic medical record.  No pertanent information unless stated regarding to the chief complaint.   Review of Systems:  No headache, visual changes, nausea, vomiting, diarrhea, constipation, dizziness, abdominal pain, skin rash, fevers, chills, night sweats, weight loss, swollen lymph nodes, body aches, joint swelling, chest pain, shortness of breath, mood changes.   Objective  Blood pressure 122/80, pulse 79, height 6' (1.829 m), weight 222 lb (100.7 kg), SpO2 96 %.   General: No apparent distress alert and oriented x3 mood and affect normal, dressed appropriately.  HEENT: Pupils equal, extraocular movements intact  Respiratory: Patient's speak in full sentences and does not appear short of breath  Cardiovascular: No lower extremity edema, non tender, no erythema  Neuro: Cranial nerves II through XII are intact, neurovascularly intact in all extremities with 2+ DTRs and 2+ pulses.  Gait normal with good balance and coordination.  MSK:  Non tender with full range of motion and good stability and symmetric strength and tone of shoulders, elbows, wrist, hip, knee bilaterally.  Left heel shows that patient does have some tender to palpation very minimally over the calcaneal aspect on the plantar aspect.  Full range of motion otherwise.       Impression and Recommendations:     The above documentation has been reviewed and is accurate and complete Lyndal Pulley, DO       Note: This dictation was prepared with Dragon dictation along with smaller phrase technology. Any transcriptional errors that result from this process are unintentional.

## 2020-02-19 ENCOUNTER — Encounter: Payer: Self-pay | Admitting: Family Medicine

## 2020-02-19 ENCOUNTER — Other Ambulatory Visit: Payer: Self-pay

## 2020-02-19 ENCOUNTER — Ambulatory Visit (INDEPENDENT_AMBULATORY_CARE_PROVIDER_SITE_OTHER): Payer: No Typology Code available for payment source | Admitting: Family Medicine

## 2020-02-19 VITALS — BP 124/78 | HR 86 | Temp 97.6°F | Ht 72.0 in | Wt 224.2 lb

## 2020-02-19 DIAGNOSIS — N529 Male erectile dysfunction, unspecified: Secondary | ICD-10-CM | POA: Diagnosis not present

## 2020-02-19 DIAGNOSIS — R6882 Decreased libido: Secondary | ICD-10-CM | POA: Diagnosis not present

## 2020-02-19 DIAGNOSIS — Z125 Encounter for screening for malignant neoplasm of prostate: Secondary | ICD-10-CM

## 2020-02-19 DIAGNOSIS — Z0000 Encounter for general adult medical examination without abnormal findings: Secondary | ICD-10-CM | POA: Diagnosis not present

## 2020-02-19 DIAGNOSIS — H9312 Tinnitus, left ear: Secondary | ICD-10-CM

## 2020-02-19 LAB — LIPID PANEL
Cholesterol: 151 mg/dL (ref 0–200)
HDL: 32.5 mg/dL — ABNORMAL LOW (ref >39.00)
LDL Cholesterol: 80 mg/dL (ref 0–99)
NonHDL: 118.94
Total CHOL/HDL Ratio: 5
Triglycerides: 193 mg/dL — ABNORMAL HIGH (ref 0.0–149.0)
VLDL: 38.6 mg/dL (ref 0.0–40.0)

## 2020-02-19 LAB — HEPATIC FUNCTION PANEL
ALT: 43 U/L (ref 0–53)
AST: 24 U/L (ref 0–37)
Albumin: 4.5 g/dL (ref 3.5–5.2)
Alkaline Phosphatase: 48 U/L (ref 39–117)
Bilirubin, Direct: 0.1 mg/dL (ref 0.0–0.3)
Total Bilirubin: 0.7 mg/dL (ref 0.2–1.2)
Total Protein: 6.6 g/dL (ref 6.0–8.3)

## 2020-02-19 LAB — BASIC METABOLIC PANEL
BUN: 15 mg/dL (ref 6–23)
CO2: 27 mEq/L (ref 19–32)
Calcium: 9.6 mg/dL (ref 8.4–10.5)
Chloride: 103 mEq/L (ref 96–112)
Creatinine, Ser: 1.05 mg/dL (ref 0.40–1.50)
GFR: 74.9 mL/min (ref >60.00)
Glucose, Bld: 102 mg/dL — ABNORMAL HIGH (ref 70–99)
Potassium: 3.9 mEq/L (ref 3.5–5.1)
Sodium: 138 mEq/L (ref 135–145)

## 2020-02-19 LAB — CBC WITH DIFFERENTIAL/PLATELET
Basophils Absolute: 0.1 10*3/uL (ref 0.0–0.1)
Basophils Relative: 0.9 % (ref 0.0–3.0)
Eosinophils Absolute: 0.1 10*3/uL (ref 0.0–0.7)
Eosinophils Relative: 2 % (ref 0.0–5.0)
HCT: 41.9 % (ref 39.0–52.0)
Hemoglobin: 15 g/dL (ref 13.0–17.0)
Lymphocytes Relative: 30.1 % (ref 12.0–46.0)
Lymphs Abs: 1.8 10*3/uL (ref 0.7–4.0)
MCHC: 35.7 g/dL (ref 30.0–36.0)
MCV: 89.2 fl (ref 78.0–100.0)
Monocytes Absolute: 0.4 10*3/uL (ref 0.1–1.0)
Monocytes Relative: 6.3 % (ref 3.0–12.0)
Neutro Abs: 3.6 10*3/uL (ref 1.4–7.7)
Neutrophils Relative %: 60.7 % (ref 43.0–77.0)
Platelets: 200 10*3/uL (ref 150.0–400.0)
RBC: 4.7 Mil/uL (ref 4.22–5.81)
RDW: 12.5 % (ref 11.5–15.5)
WBC: 5.9 10*3/uL (ref 4.0–10.5)

## 2020-02-19 LAB — TESTOSTERONE: Testosterone: 254.29 ng/dL — ABNORMAL LOW (ref 300.00–890.00)

## 2020-02-19 LAB — TSH: TSH: 1.8 u[IU]/mL (ref 0.35–4.50)

## 2020-02-19 LAB — PSA: PSA: 0.51 ng/mL (ref 0.10–4.00)

## 2020-02-19 MED ORDER — SILDENAFIL CITRATE 100 MG PO TABS: 100.0000 mg | ORAL_TABLET | Freq: Every day | ORAL | 5 refills | Status: DC | PRN

## 2020-02-19 NOTE — Patient Instructions (Signed)
Check on insurance coverage for :  -Bone density (DEXA) scan.  Dx stress fracture -coverage for colonoscopy < 50,  Consider audiology screen.   I will make referral.

## 2020-02-19 NOTE — Progress Notes (Signed)
Established Patient Office Visit  Subjective:  Patient ID: Lucas Murray, male    DOB: 29-Sep-1970  Age: 49 y.o. MRN: [id number]  CC:  Chief Complaint  Patient presents with  . Annual Exam    Pt wants to discuss back pain and tinnitus     HPI Lucas Murray presents for physical exam and for other separate issues below..  Generally very healthy.  He has had some recent issues with right heel pain.  He went to sports medicine and had ultrasound which showed stress reaction of the calcaneus.  He has concerns about potential low bone density.  No running or endurance activities.    His mom died age 70 of complications of non-Covid pneumonia last fall.  He has had difficulties coping with that.  He is not aware of his dad's family history or health history as he does not stay in touch with him.  He has had a couple of acute issues he would like to address as well.  He has had some unilateral tinnitus left ear and has had this over the past year.  He thinks he may have had some hearing loss but has not had this tested.  No vertigo.  No pulsatile tinnitus.  No right ear symptoms.  Increased erectile dysfunction several months now and also decreased libido.  Non-smoker.  He realizes some of this may be related to stress of losing his mom.  He does not feel severely depressed.  Never had testosterone screen  Health maintenance reviewed.  Tetanus up-to-date.  Covid vaccine already given.  No history of hepatitis C screening but low risk.  Past Medical History:  Diagnosis Date  . Allergy   . Chicken pox   . GERD (gastroesophageal reflux disease)   . Urinary tract infection     Past Surgical History:  Procedure Laterality Date  . COLON RESECTION N/A 07/01/2017   Procedure: LAPAROSCOPY, LAPAROTOMY WITH ILEO-CECECTOMY;  Surgeon: Excell Seltzer, MD;  Location: [facility];  Service: General;  Laterality: N/A;  . FRENULECTOMY, LINGUAL N/A    age 16  . VASECTOMY    . WISDOM TOOTH EXTRACTION  1990     Family History  Problem Relation Age of Onset  . Arthritis Mother   . Hyperlipidemia Mother   . Hypertension Mother   . Diabetes Mother        type 1.5  . Hypothyroidism Mother   . Arthritis Maternal Grandmother   . Heart disease Maternal Grandfather   . Cancer Maternal Grandfather        lung  . Hypertension Maternal Grandfather     Social History   Socioeconomic History  . Marital status: Married    Spouse name: Not on file  . Number of children: Not on file  . Years of education: Not on file  . Highest education level: Not on file  Occupational History  . Not on file  Tobacco Use  . Smoking status: Never Smoker  . Smokeless tobacco: Never Used  Vaping Use  . Vaping Use: Never used  Substance and Sexual Activity  . Alcohol use: Yes    Comment: occasional   . Drug use: No  . Sexual activity: Not on file  Other Topics Concern  . Not on file  Social History Narrative  . Not on file   Social Determinants of Health   Financial Resource Strain:   . Difficulty of Paying Living Expenses:   Food Insecurity:   . Worried About [employer] of  Food in the Last Year:   . [place] in the Last Year:   Transportation Needs:   . Film/video editor (Medical):   Marland Kitchen Lack of Transportation (Non-Medical):   Physical Activity:   . Days of Exercise per Week:   . Minutes of Exercise per Session:   Stress:   . Feeling of Stress :   Social Connections:   . Frequency of Communication with Friends and Family:   . Frequency of Social Gatherings with Friends and Family:   . Attends Religious Services:   . Active Member of Clubs or Organizations:   . Attends Archivist Meetings:   Marland Kitchen Marital Status:   Intimate Partner Violence:   . Fear of Current or Ex-Partner:   . Emotionally Abused:   Marland Kitchen Physically Abused:   . Sexually Abused:     Outpatient Medications Prior to Visit  Medication Sig Dispense Refill  . Vitamin D, Ergocalciferol, (DRISDOL) 1.25 MG  (50000 UNIT) CAPS capsule Take 1 capsule (50,000 Units total) by mouth every 7 (seven) days. 12 capsule 0   No facility-administered medications prior to visit.    Allergies  Allergen Reactions  . E-Mycin [Erythromycin] Anaphylaxis  . Penicillins     Has patient had a PCN reaction causing immediate rash, facial/tongue/throat swelling, SOB or lightheadedness with hypotension: No Has patient had a PCN reaction causing severe rash involving mucus membranes or skin necrosis: No Has patient had a PCN reaction that required hospitalization: No Has patient had a PCN reaction occurring within the last 10 years: No If all of the above answers are "NO", then may proceed with Cephalosporin use.     ROS Review of Systems  Constitutional: Negative for activity change, appetite change, fatigue, fever and unexpected weight change.  HENT: Negative for congestion, ear pain and trouble swallowing.   Eyes: Negative for pain and visual disturbance.  Respiratory: Negative for cough, shortness of breath and wheezing.   Cardiovascular: Negative for chest pain and palpitations.  Gastrointestinal: Negative for abdominal distention, abdominal pain, blood in stool, constipation, diarrhea, nausea, rectal pain and vomiting.  Genitourinary: Negative for dysuria, hematuria and testicular pain.  Musculoskeletal: Negative for arthralgias and joint swelling.  Skin: Negative for rash.  Neurological: Negative for dizziness, syncope and headaches.  Hematological: Negative for adenopathy.  Psychiatric/Behavioral: Negative for confusion and dysphoric mood.      Objective:    Physical Exam Constitutional:      General: He is not in acute distress.    Appearance: He is well-developed.  HENT:     Head: Normocephalic and atraumatic.     Right Ear: External ear normal.     Left Ear: External ear normal.  Eyes:     Conjunctiva/sclera: Conjunctivae normal.     Pupils: Pupils are equal, round, and reactive to light.   Neck:     Thyroid: No thyromegaly.  Cardiovascular:     Rate and Rhythm: Normal rate and regular rhythm.     Heart sounds: Normal heart sounds. No murmur heard.   Pulmonary:     Effort: No respiratory distress.     Breath sounds: No wheezing or rales.  Abdominal:     General: Bowel sounds are normal. There is no distension.     Palpations: Abdomen is soft. There is no mass.     Tenderness: There is no abdominal tenderness. There is no guarding or rebound.  Musculoskeletal:     Cervical back: Normal range of motion and neck  supple.  Lymphadenopathy:     Cervical: No cervical adenopathy.  Skin:    Findings: No rash.  Neurological:     Mental Status: He is alert and oriented to person, place, and time.     Cranial Nerves: No cranial nerve deficit.     Deep Tendon Reflexes: Reflexes normal.     BP 124/78 (BP Location: Left Arm, Patient Position: Sitting, Cuff Size: Normal)   Pulse 86   Temp 97.6 F (36.4 C) (Temporal)   Ht 6' (1.829 m)   Wt 224 lb 3.2 oz (101.7 kg)   SpO2 97%   BMI 30.41 kg/m  Wt Readings from Last 3 Encounters:  02/19/20 224 lb 3.2 oz (101.7 kg)  02/10/20 222 lb (100.7 kg)  01/20/20 222 lb (100.7 kg)     Health Maintenance Due  Topic Date Due  . Hepatitis C Screening  Never done    There are no preventive care reminders to display for this patient.  Lab Results  Component Value Date   TSH 1.32 04/16/2018   Lab Results  Component Value Date   WBC 5.6 04/16/2018   HGB 15.9 04/16/2018   HCT 45.5 04/16/2018   MCV 88.9 04/16/2018   PLT 213.0 04/16/2018   Lab Results  Component Value Date   NA 137 04/16/2018   K 4.4 04/16/2018   CO2 31 04/16/2018   GLUCOSE 97 04/16/2018   BUN 12 04/16/2018   CREATININE 1.18 04/16/2018   BILITOT 0.9 04/16/2018   ALKPHOS 48 04/16/2018   AST 18 04/16/2018   ALT 24 04/16/2018   PROT 7.3 04/16/2018   ALBUMIN 4.8 04/16/2018   CALCIUM 10.1 04/16/2018   ANIONGAP 9 07/04/2017   GFR 70.12 04/16/2018   Lab  Results  Component Value Date   CHOL 149 04/16/2018   Lab Results  Component Value Date   HDL 30.20 (L) 04/16/2018   Lab Results  Component Value Date   LDLCALC 89 04/16/2018   Lab Results  Component Value Date   TRIG 150.0 (H) 04/16/2018   Lab Results  Component Value Date   CHOLHDL 5 04/16/2018   No results found for: HGBA1C    Assessment & Plan:   Problem List Items Addressed This Visit    None    Visit Diagnoses    Physical exam    -  Primary   Relevant Orders   Basic metabolic panel   Lipid panel   CBC with Differential/Platelet   TSH   Hepatic function panel   PSA   Hep C Antibody   Low libido       Relevant Orders   Testosterone    We addressed several health maintenance issues.  We will check hepatitis C antibody.  Check screening labs.  Consider DEXA scan based on history above.  He will check on insurance coverage first  We discussed possible colonoscopy and discussed changing guidelines to consider after age 37.  He will check on insurance coverage for that  Set up audiology referral regarding his unilateral tinnitus to get his hearing screen.  Follow-up immediately for any pulsatile tinnitus or other new symptoms  Check on total testosterone level and recommend trial of Viagra 100 mg - one half to one tablet daily as needed.    Meds ordered this encounter  Medications  . sildenafil (VIAGRA) 100 MG tablet    Sig: Take 1 tablet (100 mg total) by mouth daily as needed for erectile dysfunction.    Dispense:  10  tablet    Refill:  5    Follow-up: No follow-ups on file.    Carolann Littler, MD

## 2020-02-22 ENCOUNTER — Other Ambulatory Visit: Payer: Self-pay

## 2020-02-22 DIAGNOSIS — R7989 Other specified abnormal findings of blood chemistry: Secondary | ICD-10-CM

## 2020-02-22 LAB — HEPATITIS C ANTIBODY
Hepatitis C Ab: NONREACTIVE
SIGNAL TO CUT-OFF: 0 (ref <1.00)

## 2020-02-24 ENCOUNTER — Encounter: Payer: Self-pay | Admitting: Family Medicine

## 2020-02-24 MED ORDER — SILDENAFIL CITRATE 100 MG PO TABS: 100.0000 mg | ORAL_TABLET | Freq: Every day | ORAL | 5 refills | Status: DC | PRN

## 2020-02-25 ENCOUNTER — Other Ambulatory Visit: Payer: Self-pay

## 2020-02-25 ENCOUNTER — Ambulatory Visit: Payer: No Typology Code available for payment source | Attending: Family Medicine | Admitting: Audiology

## 2020-02-25 DIAGNOSIS — H9313 Tinnitus, bilateral: Secondary | ICD-10-CM

## 2020-02-25 DIAGNOSIS — H938X3 Other specified disorders of ear, bilateral: Secondary | ICD-10-CM | POA: Diagnosis present

## 2020-02-25 NOTE — Procedures (Signed)
  [facility] [place], [place]  [address] [phone]  AUDIOLOGICAL  EVALUATION  NAME: Lucas Murray     DOB:   1970-09-10      MRN: [id number]                                                                                     DATE: 02/25/2020     REFERENT: Eulas Post, MD STATUS: Outpatient DIAGNOSIS: Tinnitus    History: Demarian was seen for an audiological evaluation and reports decreased hearing and aural fullness occurring over the past 3 weeks. [name] also reports his tinnitus has worsened in severity over the past few weeks, is worse in the left ear, and is constant. There is no reported otalgia or dizziness.   Evaluation:   Otoscopy showed a clear view of the tympanic membranes, bilaterally  Tympanometry results were consistent with normal middle ear pressure and normal tympanic membrane mobility in the left ear and normal middle ear pressure and hyper-compliant tympanic membrane mobility int he left ear.   Audiometric testing was completed using Conventional Audiometry techniques with insert earphones and TDH headphones. 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 A Speech Recognition Thresholds were obtained at 15 dB HL, bilaterally. Word Recognition Testing was completed using W-22 Word List at 45 dB HL and [name] scored 96% in the right ear and 100% in the left ear   Results: Today's test results are consistent with normal hearing sensitivity, bilaterally, with the exception of a mild hearing loss at 8000 Hz in the left. The test results were reviewed with Thurmond Butts.  Blong may have listening difficulty and communication difficulty in adverse listening situations and will benefit from the use of good communication strategies. Tinnitus management and stressors were reviewed with Thurmond Butts.    Recommendations: 1. Referral to an Ear, Nose, and Throat Physician if tinnitus worsens 2. Monitor Hearing Sensitivity    Bari Mantis Audiologist, Au.D., CCC-A 02/25/2020  3:49 PM  Cc: Eulas Post, MD

## 2020-03-16 ENCOUNTER — Other Ambulatory Visit: Payer: Self-pay

## 2020-03-16 ENCOUNTER — Other Ambulatory Visit (INDEPENDENT_AMBULATORY_CARE_PROVIDER_SITE_OTHER): Payer: No Typology Code available for payment source

## 2020-03-16 DIAGNOSIS — R7989 Other specified abnormal findings of blood chemistry: Secondary | ICD-10-CM | POA: Diagnosis not present

## 2020-03-16 LAB — TESTOSTERONE: Testosterone: 264 ng/dL (ref 250–827)

## 2020-03-18 ENCOUNTER — Other Ambulatory Visit: Payer: Self-pay

## 2020-03-18 DIAGNOSIS — R7989 Other specified abnormal findings of blood chemistry: Secondary | ICD-10-CM

## 2020-03-29 ENCOUNTER — Ambulatory Visit: Payer: No Typology Code available for payment source | Admitting: Family Medicine

## 2020-04-06 ENCOUNTER — Other Ambulatory Visit: Payer: Self-pay | Admitting: Family Medicine

## 2020-09-06 ENCOUNTER — Ambulatory Visit (HOSPITAL_COMMUNITY)
Admission: RE | Admit: 2020-09-06 | Discharge: 2020-09-06 | Disposition: A | Discharge status: Home / Self Care | Payer: No Typology Code available for payment source | Source: Ambulatory Visit | Attending: Vascular Surgery | Admitting: Vascular Surgery

## 2020-09-06 ENCOUNTER — Ambulatory Visit (INDEPENDENT_AMBULATORY_CARE_PROVIDER_SITE_OTHER): Payer: No Typology Code available for payment source | Admitting: Vascular Surgery

## 2020-09-06 ENCOUNTER — Other Ambulatory Visit: Payer: Self-pay

## 2020-09-06 ENCOUNTER — Encounter: Payer: Self-pay | Admitting: Vascular Surgery

## 2020-09-06 VITALS — BP 122/82 | HR 69 | Temp 98.0°F | Resp 18 | Ht 72.0 in | Wt 216.0 lb

## 2020-09-06 DIAGNOSIS — I728 Aneurysm of other specified arteries: Secondary | ICD-10-CM

## 2020-09-06 NOTE — Progress Notes (Signed)
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 male, with history of bowel obstruction that presents for one year follow-up of an incidentally discovered splenic artery aneurysm.  Patient was hospitalized in November 2018 with a bowel obstruction during which he underwent laparotomy with ileocecectomy.  On the CT in 2018 there was an incidental discovery of a splenic artery aneurysm.    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  No significant changes to his health since I last saw him.  He denies any abdominal pain.  Past Medical History:  Diagnosis Date  . Allergy   . Chicken pox   . GERD (gastroesophageal reflux disease)   . Urinary tract infection     Past Surgical History:  Procedure Laterality Date  . COLON RESECTION N/A 07/01/2017   Procedure: LAPAROSCOPY, LAPAROTOMY WITH ILEO-CECECTOMY;  Surgeon: Excell Seltzer, MD;  Location: [facility];  Service: General;  Laterality: N/A;  . FRENULECTOMY, LINGUAL N/A    age 8  . VASECTOMY    . WISDOM TOOTH EXTRACTION  1990    Family History  Problem Relation Age of Onset  . Arthritis Mother   . Hyperlipidemia Mother   . Hypertension Mother   . Diabetes Mother        type 1.5  . Hypothyroidism Mother   . Arthritis Maternal Grandmother   . Heart disease Maternal Grandfather   . Cancer Maternal Grandfather        lung  . Hypertension Maternal Grandfather     SOCIAL HISTORY: Social History   Socioeconomic History  . Marital status: Married    Spouse name: Not on file  . Number of children: Not on file  . Years of education: Not on file  . Highest education level: Not on file  Occupational History  . Not on file  Tobacco Use  .  Smoking status: Never Smoker  . Smokeless tobacco: Never Used  Vaping Use  . Vaping Use: Never used  Substance and Sexual Activity  . Alcohol use: Yes    Comment: occasional   . Drug use: No  . Sexual activity: Not on file  Other Topics Concern  . Not on file  Social History Narrative  . Not on file   Social Determinants of Health   Financial Resource Strain: Not on file  Food Insecurity: Not on file  Transportation Needs: Not on file  Physical Activity: Not on file  Stress: Not on file  Social Connections: Not on file  Intimate Partner Violence: Not on file    Allergies  Allergen Reactions  . E-Mycin [Erythromycin] Anaphylaxis  . Penicillins     Has patient had a PCN reaction causing immediate rash, facial/tongue/throat swelling, SOB or lightheadedness with hypotension: No Has patient had a PCN reaction causing severe rash involving mucus membranes or skin necrosis: No Has patient had a PCN reaction that required hospitalization: No Has patient had a PCN reaction occurring within the last 10 years: No If all of the above answers are "NO", then may proceed with Cephalosporin use.     Current Outpatient Medications  Medication Sig Dispense Refill  . sildenafil (VIAGRA) 100 MG tablet Take 1 tablet (100 mg total)  by mouth daily as needed for erectile dysfunction. 10 tablet 5  . Vitamin D, Ergocalciferol, (DRISDOL) 1.25 MG (50000 UNIT) CAPS capsule TAKE 1 CAPSULE (50,000 UNITS TOTAL) BY MOUTH EVERY 7 (SEVEN) DAYS. (Patient not taking: Reported on 09/06/2020) 12 capsule 0   No current facility-administered medications for this visit.    REVIEW OF SYSTEMS:  [X]  denotes positive finding, [ ]  denotes negative finding Cardiac  Comments:  Chest pain or chest pressure:    Shortness of breath upon exertion:    Short of breath when lying flat:    Irregular heart rhythm:        Vascular    Pain in calf, thigh, or hip brought on by ambulation:    Pain in feet at night that  wakes you up from your sleep:     Blood clot in your veins:    Leg swelling:         Pulmonary    Oxygen at home:    Productive cough:     Wheezing:         Neurologic    Sudden weakness in arms or legs:     Sudden numbness in arms or legs:     Sudden onset of difficulty speaking or slurred speech:    Temporary loss of vision in one eye:     Problems with dizziness:         Gastrointestinal    Blood in stool:     Vomited blood:         Genitourinary    Burning when urinating:     Blood in urine:        Psychiatric    Major depression:         Hematologic    Bleeding problems:    Problems with blood clotting too easily:        Skin    Rashes or ulcers:        Constitutional    Fever or chills:      PHYSICAL EXAM: Vitals:   09/06/20 0915  BP: 122/82  Pulse: 69  Resp: 18  Temp: 98 F (36.7 C)  TempSrc: Temporal  SpO2: 95%  Weight: 216 lb (98 kg)  Height: 6' (1.829 m)    GENERAL: The patient is a well-nourished male, in no acute distress. The vital signs are documented above. CARDIAC: There is a regular rate and rhythm.  VASCULAR:  2+ palpable femoral pulse bilateral groins PULMONARY: No respiratory distress. ABDOMEN: Soft and non-tender.  No rebound or guarding.  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  Unfortunately we could not visualize this on duplex in the office today.  I reviewed with him that his last CT was in 2019 and given we could see his aneurysm on ultrasound in the office I think it would be reasonable to get another CT scan.  Once we have more data points of his aneurysm not growing we can likely stretch out his surveillance.  Discussed at 2.2 cm no indication for surgical intervention at this time.  Discussed that if he were male or  planning pregnancy we would be much more aggressive about intervention.  I will schedule phone visit with him after his CT is complete to discuss findings.  Marty Heck, MD [facility] Office: [place]

## 2020-10-04 ENCOUNTER — Other Ambulatory Visit: Payer: No Typology Code available for payment source

## 2020-10-05 ENCOUNTER — Other Ambulatory Visit: Payer: Self-pay

## 2020-10-05 ENCOUNTER — Ambulatory Visit
Admission: RE | Admit: 2020-10-05 | Discharge: 2020-10-05 | Disposition: A | Discharge status: Home / Self Care | Payer: No Typology Code available for payment source | Source: Ambulatory Visit | Attending: Vascular Surgery | Admitting: Vascular Surgery

## 2020-10-05 DIAGNOSIS — I728 Aneurysm of other specified arteries: Secondary | ICD-10-CM

## 2020-10-05 MED ORDER — IOPAMIDOL (ISOVUE-370) INJECTION 76%
75.0000 mL | Freq: Once | INTRAVENOUS | Status: AC | PRN
  Administered 2020-10-05: 75 mL via INTRAVENOUS

## 2020-10-11 ENCOUNTER — Encounter: Payer: Self-pay | Admitting: Vascular Surgery

## 2020-10-11 ENCOUNTER — Ambulatory Visit (INDEPENDENT_AMBULATORY_CARE_PROVIDER_SITE_OTHER): Payer: No Typology Code available for payment source | Admitting: Vascular Surgery

## 2020-10-11 VITALS — Ht 72.0 in | Wt 215.0 lb

## 2020-10-11 DIAGNOSIS — I728 Aneurysm of other specified arteries: Secondary | ICD-10-CM

## 2020-10-11 NOTE — Progress Notes (Signed)
    Virtual Visit via Telephone Note   I connected with Lucas Murray on 10/11/2020 using the [employer] by telephone and verified that I was speaking with the correct person using two identifiers.     PCP: Eulas Post, MD   Chief Complaint: Follow-up CT scan for surveillance of incidental splenic artery aneurysm  History of Present Illness:  50 y.o. male, with history of bowel obstruction that presented for one year follow-up of an incidentally discovered splenic artery aneurysm.  Patient was hospitalized in November 2018 with a bowel obstruction during which he underwent laparotomy with ileocecectomy.  On the CT in 2018 there was an incidental discovery of a splenic artery aneurysm.    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  We could not visualize this on mesenteric duplex this year and ultimately CT was ordered.   Past Medical History:  Diagnosis Date  . Allergy   . Chicken pox   . GERD (gastroesophageal reflux disease)   . Urinary tract infection     Past Surgical History:  Procedure Laterality Date  . COLON RESECTION N/A 07/01/2017   Procedure: LAPAROSCOPY, LAPAROTOMY WITH ILEO-CECECTOMY;  Surgeon: Excell Seltzer, MD;  Location: [facility];  Service: General;  Laterality: N/A;  . FRENULECTOMY, LINGUAL N/A    age 58  . VASECTOMY    . [place] EXTRACTION  1990    No outpatient medications have been marked as taking for the 10/11/20 encounter (Office Visit) with Marty Heck, MD.    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  Assessment and Plan:  50 year old male I contacted to discuss recent CT findings for surveillance of incidental 2.2  cm splenic artery aneurysm.  Discussed his CT scan shows a stable splenic artery aneurysm and I compared the scan to a CT in 2018 and there is essentially no interval change.  Given the stability over time, I think we can go to 2-year interval surveillance.  We have had some issues visualizing his aneurysm on mesenteric duplex in the past but this would be the most cost effective means with no radiation exposure and I still think this should be the first step.  Follow Up Instructions:   Follow up 2 years   I discussed the assessment and treatment plan with the patient. The patient was provided an opportunity to ask questions and all were answered. The patient agreed with the plan and demonstrated an understanding of the instructions.   The patient was advised to call back or seek an in-person evaluation if the symptoms worsen or if the condition fails to improve as anticipated.     Signed, Marty Heck [facility] Office: [phone]  10/11/2020, 2:20 PM

## 2020-10-19 ENCOUNTER — Other Ambulatory Visit: Payer: Self-pay | Admitting: *Deleted

## 2020-10-19 DIAGNOSIS — Z1211 Encounter for screening for malignant neoplasm of colon: Secondary | ICD-10-CM

## 2020-11-08 ENCOUNTER — Encounter: Payer: Self-pay | Admitting: Gastroenterology

## 2020-12-06 ENCOUNTER — Telehealth (INDEPENDENT_AMBULATORY_CARE_PROVIDER_SITE_OTHER): Payer: No Typology Code available for payment source | Admitting: Family Medicine

## 2020-12-06 ENCOUNTER — Other Ambulatory Visit: Payer: Self-pay

## 2020-12-06 DIAGNOSIS — U071 COVID-19: Secondary | ICD-10-CM

## 2020-12-06 NOTE — Progress Notes (Signed)
Patient ID: Lucas Murray, male   DOB: 1970-10-22, 50 y.o.   MRN: [id number]  This visit type was conducted due to national recommendations for restrictions regarding the COVID-19 pandemic in an effort to limit this patient's exposure and mitigate transmission in our community.   Virtual Visit via Video Note  I connected with Lucas Murray on 12/06/20 at 10:45 AM EDT by a video enabled telemedicine application and verified that I am speaking with the correct person using two identifiers.  Location patient: home Location provider:work or home office Persons participating in the virtual visit: patient, provider  I discussed the limitations of evaluation and management by telemedicine and the availability of in person appointments. The patient expressed understanding and agreed to proceed.   HPI:  [place] called with positive COVID diagnosis.  He states he was at [employer] [place] last week with his family.  He thinks he contacted them.  On the drive back Saturday his wife started having fever and he recalls having mild sore throat.  His wife tested positive for COVID on Sunday.  He was tested on Sunday and negative.  By Sunday though his symptoms worsened and he had increased fever, cough, congestion, body aches, fatigue, headache.  He went back yesterday and had repeat COVID test which came back positive.  He has had previous vaccination including booster in November.  He states he actually feels much better today.  Still has some fatigue and aches but overall symptoms seem to be improving.  No nausea or vomiting.  Generally healthy.  No history of smoking.  No chronic heart or lung issues.  His children have no symptoms at this time.  Wife is improving.   ROS: See pertinent positives and negatives per HPI.  Past Medical History:  Diagnosis Date  . Allergy   . Chicken pox   . GERD (gastroesophageal reflux disease)   . Urinary tract infection     Past Surgical History:  Procedure Laterality Date  .  COLON RESECTION N/A 07/01/2017   Procedure: LAPAROSCOPY, LAPAROTOMY WITH ILEO-CECECTOMY;  Surgeon: Excell Seltzer, MD;  Location: [facility];  Service: General;  Laterality: N/A;  . FRENULECTOMY, LINGUAL N/A    age 11  . VASECTOMY    . WISDOM TOOTH EXTRACTION  1990    Family History  Problem Relation Age of Onset  . Arthritis Mother   . Hyperlipidemia Mother   . Hypertension Mother   . Diabetes Mother        type 1.5  . Hypothyroidism Mother   . Arthritis Maternal Grandmother   . Heart disease Maternal Grandfather   . Cancer Maternal Grandfather        lung  . Hypertension Maternal Grandfather     SOCIAL HX: Non-smoker   Current Outpatient Medications:  .  sildenafil (VIAGRA) 100 MG tablet, Take 1 tablet (100 mg total) by mouth daily as needed for erectile dysfunction., Disp: 10 tablet, Rfl: 5 .  Vitamin D, Ergocalciferol, (DRISDOL) 1.25 MG (50000 UNIT) CAPS capsule, TAKE 1 CAPSULE (50,000 UNITS TOTAL) BY MOUTH EVERY 7 (SEVEN) DAYS., Disp: 12 capsule, Rfl: 0  EXAM:  VITALS per patient if applicable:  GENERAL: alert, oriented, appears well and in no acute distress  HEENT: atraumatic, conjunttiva clear, no obvious abnormalities on inspection of external nose and ears  NECK: normal movements of the head and neck  LUNGS: on inspection no signs of respiratory distress, breathing rate appears normal, no obvious gross SOB, gasping or wheezing  CV: no obvious cyanosis  MS: moves all visible extremities without noticeable abnormality  PSYCH/NEURO: pleasant and cooperative, no obvious depression or anxiety, speech and thought processing grossly intact  ASSESSMENT AND PLAN:  Discussed the following assessment and plan:  COVID-19 infection.  Patient had onset of initial symptoms on 4-22/2022.  Symptoms improved today compared with yesterday.  -We discussed supportive treatment with plenty of fluids and Tylenol and/or Advil for body aches and fever -He is aware of isolation  precautions -We did discuss possible treatment options including monoclonal antibodies and oral medications.  At this point he feels improved enough from yesterday that he would like to observe without medications. -Follow-up immediately for increased shortness of breath or any other concerns     I discussed the assessment and treatment plan with the patient. The patient was provided an opportunity to ask questions and all were answered. The patient agreed with the plan and demonstrated an understanding of the instructions.   The patient was advised to call back or seek an in-person evaluation if the symptoms worsen or if the condition fails to improve as anticipated.     Carolann Littler, MD

## 2020-12-26 ENCOUNTER — Ambulatory Visit (AMBULATORY_SURGERY_CENTER): Payer: No Typology Code available for payment source

## 2020-12-26 ENCOUNTER — Other Ambulatory Visit: Payer: Self-pay

## 2020-12-26 VITALS — Ht 72.0 in | Wt 212.0 lb

## 2020-12-26 DIAGNOSIS — Z1211 Encounter for screening for malignant neoplasm of colon: Secondary | ICD-10-CM

## 2020-12-26 MED ORDER — SUTAB 1479-225-188 MG PO TABS: 1.0000 | ORAL_TABLET | ORAL | 0 refills | Status: DC

## 2020-12-26 NOTE — Progress Notes (Signed)
Pre visit completed via phone call; Patient verified name, DOB, and address; No egg or soy allergy known to patient  No issues with past sedation with any surgeries or procedures Patient denies ever being told they had issues or difficulty with intubation  No FH of Malignant Hyperthermia No diet pills per patient No home 02 use per patient  No blood thinners per patient  Pt denies issues with constipation  No A fib or A flutter  EMMI video via MyChart  COVID 19 guidelines implemented in [place] today with Pt and RN  Coupon given to pt in PV today, Code to Pharmacy and NO PA's for preps discussed with pt in PV today  Discussed with pt there will be an out-of-pocket cost for prep and that varies from $0 to 70 dollars  Due to the COVID-19 pandemic we are asking patients to follow certain guidelines.  Pt aware of COVID protocols and [facility] guidelines

## 2020-12-30 ENCOUNTER — Encounter: Payer: Self-pay | Admitting: Gastroenterology

## 2021-01-04 ENCOUNTER — Encounter: Payer: No Typology Code available for payment source | Admitting: Gastroenterology

## 2021-01-20 ENCOUNTER — Encounter: Payer: Self-pay | Admitting: Gastroenterology

## 2021-01-20 ENCOUNTER — Other Ambulatory Visit: Payer: Self-pay

## 2021-01-20 ENCOUNTER — Ambulatory Visit (AMBULATORY_SURGERY_CENTER): Payer: No Typology Code available for payment source | Admitting: Gastroenterology

## 2021-01-20 VITALS — BP 122/83 | HR 62 | Temp 98.0°F | Resp 18 | Ht 72.0 in | Wt 212.0 lb

## 2021-01-20 DIAGNOSIS — Z1211 Encounter for screening for malignant neoplasm of colon: Secondary | ICD-10-CM

## 2021-01-20 DIAGNOSIS — D124 Benign neoplasm of descending colon: Secondary | ICD-10-CM

## 2021-01-20 MED ORDER — SODIUM CHLORIDE 0.9 % IV SOLN: 500.0000 mL | Freq: Once | INTRAVENOUS | Status: DC

## 2021-01-20 NOTE — Patient Instructions (Signed)
Please read handouts provided. Continue present medications. Await pathology results.   YOU HAD AN ENDOSCOPIC PROCEDURE TODAY AT THE [facility]:   Refer to the procedure report that was given to you for any specific questions about what was found during the examination.  If the procedure report does not answer your questions, please call your gastroenterologist to clarify.  If you requested that your care partner not be given the details of your procedure findings, then the procedure report has been included in a sealed envelope for you to review at your convenience later.  YOU SHOULD EXPECT: Some feelings of bloating in the abdomen. Passage of more gas than usual.  Walking can help get rid of the air that was put into your GI tract during the procedure and reduce the bloating. If you had a lower endoscopy (such as a colonoscopy or flexible sigmoidoscopy) you may notice spotting of blood in your stool or on the toilet paper. If you underwent a bowel prep for your procedure, you may not have a normal bowel movement for a few days.  Please Note:  You might notice some irritation and congestion in your nose or some drainage.  This is from the oxygen used during your procedure.  There is no need for concern and it should clear up in a day or so.  SYMPTOMS TO REPORT IMMEDIATELY:  Following lower endoscopy (colonoscopy or flexible sigmoidoscopy):  Excessive amounts of blood in the stool  Significant tenderness or worsening of abdominal pains  Swelling of the abdomen that is new, acute  Fever of 100F or higher   For urgent or emergent issues, a gastroenterologist can be reached at any hour by calling [phone]. Do not use MyChart messaging for urgent concerns.    DIET:  We do recommend a small meal at first, but then you may proceed to your regular diet.  Drink plenty of fluids but you should avoid alcoholic beverages for 24 hours.  ACTIVITY:  You should plan to take it easy  for the rest of today and you should NOT DRIVE or use heavy machinery until tomorrow (because of the sedation medicines used during the test).    FOLLOW UP: Our staff will call the number listed on your records 48-72 hours following your procedure to check on you and address any questions or concerns that you may have regarding the information given to you following your procedure. If we do not reach you, we will leave a message.  We will attempt to reach you two times.  During this call, we will ask if you have developed any symptoms of COVID 19. If you develop any symptoms (ie: fever, flu-like symptoms, shortness of breath, cough etc.) before then, please call [phone].  If you test positive for Covid 19 in the 2 weeks post procedure, please call and report this information to us.    If any biopsies were taken you will be contacted by phone or by letter within the next 1-3 weeks.  Please call us at [phone] if you have not heard about the biopsies in 3 weeks.    SIGNATURES/CONFIDENTIALITY: You and/or your care partner have signed paperwork which will be entered into your electronic medical record.  These signatures attest to the fact that that the information above on your After Visit Summary has been reviewed and is understood.  Full responsibility of the confidentiality of this discharge information lies with you and/or your care-partner.  

## 2021-01-20 NOTE — Progress Notes (Signed)
Called to room to assist during endoscopic procedure.  Patient ID and intended procedure confirmed with present staff. Received instructions for my participation in the procedure from the performing physician.  

## 2021-01-20 NOTE — Progress Notes (Signed)
A and O x3. Report to RN. Tolerated MAC anesthesia well. 

## 2021-01-20 NOTE — Op Note (Signed)
[facility] Patient Name: Lucas Murray Procedure Date: 01/20/2021 9:24 AM MRN: [id number] Endoscopist: Remo Lipps P. Lucas Murray , MD Age: 50 Referring MD:  Date of Birth: May 22, 1971 Gender: Male Account #: [id number] Procedure:                Colonoscopy Indications:              Screening for colorectal malignant neoplasm, This                            is the patient's first colonoscopy Medicines:                Monitored Anesthesia Care Procedure:                Pre-Anesthesia Assessment:                           - Prior to the procedure, a History and Physical                            was performed, and patient medications and                            allergies were reviewed. The patient's tolerance of                            previous anesthesia was also reviewed. The risks                            and benefits of the procedure and the sedation                            options and risks were discussed with the patient.                            All questions were answered, and informed consent                            was obtained. Prior Anticoagulants: The patient has                            taken no previous anticoagulant or antiplatelet                            agents. ASA Grade Assessment: II - A patient with                            mild systemic disease. After reviewing the risks                            and benefits, the patient was deemed in                            satisfactory condition to undergo the procedure.  After obtaining informed consent, the colonoscope                            was passed under direct vision. Throughout the                            procedure, the patient's blood pressure, pulse, and                            oxygen saturations were monitored continuously. The                            Olympus [id number]) [id number] was introduced                            through the anus and  advanced to the the                            ileocolonic anastomosis. The colonoscopy was                            performed without difficulty. The patient tolerated                            the procedure well. The quality of the bowel                            preparation was good. The terminal ileum was                            photographed. Scope In: 9:28:33 AM Scope Out: 9:52:44 AM Scope Withdrawal Time: 0 hours 15 minutes 37 seconds  Total Procedure Duration: 0 hours 24 minutes 11 seconds  Findings:                 The perianal and digital rectal examinations were                            normal.                           The terminal ileum appeared normal.                           There was evidence of a prior end-to-side                            ileo-colonic anastomosis in the ascending colon.                            This was patent and was characterized by healthy                            appearing mucosa.  A 4 mm polyp was found in the descending colon. The                            polyp was sessile. The polyp was removed with a                            cold snare. Resection and retrieval were complete.                           The colon revealed excessive looping, redundant                            colon with tortousity, reaching extent of exam was                            technically challenging and required abdominal                            pressure.                           Internal hemorrhoids were found during retroflexion.                           The exam was otherwise without abnormality. Complications:            No immediate complications. Estimated blood loss:                            Minimal. Estimated Blood Loss:     Estimated blood loss was minimal. Impression:               - The examined portion of the ileum was normal.                           - Patent end-to-side ileo-colonic anastomosis,                             characterized by healthy appearing mucosa.                           - One 4 mm polyp in the descending colon, removed                            with a cold snare. Resected and retrieved.                           - There was significant looping of the colon.                           - Internal hemorrhoids.                           - The examination was otherwise normal. Recommendation:           - Patient has a contact number  available for                            emergencies. The signs and symptoms of potential                            delayed complications were discussed with the                            patient. Return to normal activities tomorrow.                            Written discharge instructions were provided to the                            patient.                           - Resume previous diet.                           - Continue present medications.                           - Await pathology results. Remo Lipps P. Roise Emert, MD 01/20/2021 9:59:35 AM This report has been signed electronically.

## 2021-01-24 ENCOUNTER — Telehealth: Payer: Self-pay | Admitting: *Deleted

## 2021-01-24 ENCOUNTER — Telehealth: Payer: Self-pay

## 2021-01-24 NOTE — Telephone Encounter (Signed)
  Follow up Call-  Call back number 01/20/2021  Post procedure Call Back phone  # [phone]  Permission to leave phone message Yes  Some recent data might be hidden     Patient questions:  Do you have a fever, pain , or abdominal swelling? No. Pain Score  0 *  Have you tolerated food without any problems? Yes.    Have you been able to return to your normal activities? Yes.    Do you have any questions about your discharge instructions: Diet   No. Medications  No. Follow up visit  No.  Do you have questions or concerns about your Care? No.  Actions: * If pain score is 4 or above: No action needed, pain <4.  Have you developed a fever since your procedure? no  2.   Have you had an respiratory symptoms (SOB or cough) since your procedure? no  3.   Have you tested positive for COVID 19 since your procedure no  4.   Have you had any family members/close contacts diagnosed with the COVID 19 since your procedure?  no   If yes to any of these questions please route to Joylene John, RN and Joella Prince, RN

## 2021-01-24 NOTE — Telephone Encounter (Signed)
Message left

## 2021-02-20 ENCOUNTER — Other Ambulatory Visit: Payer: Self-pay

## 2021-02-20 ENCOUNTER — Ambulatory Visit (INDEPENDENT_AMBULATORY_CARE_PROVIDER_SITE_OTHER): Payer: No Typology Code available for payment source | Admitting: Family Medicine

## 2021-02-20 ENCOUNTER — Encounter: Payer: Self-pay | Admitting: Family Medicine

## 2021-02-20 VITALS — BP 130/80 | HR 75 | Temp 98.0°F | Ht 72.0 in | Wt 219.8 lb

## 2021-02-20 DIAGNOSIS — Z0000 Encounter for general adult medical examination without abnormal findings: Secondary | ICD-10-CM

## 2021-02-20 LAB — CBC WITH DIFFERENTIAL/PLATELET
Basophils Absolute: 0.1 10*3/uL (ref 0.0–0.1)
Basophils Relative: 0.9 % (ref 0.0–3.0)
Eosinophils Absolute: 0.1 10*3/uL (ref 0.0–0.7)
Eosinophils Relative: 2.3 % (ref 0.0–5.0)
HCT: 44.5 % (ref 39.0–52.0)
Hemoglobin: 15.9 g/dL (ref 13.0–17.0)
Lymphocytes Relative: 33.8 % (ref 12.0–46.0)
Lymphs Abs: 1.9 10*3/uL (ref 0.7–4.0)
MCHC: 35.7 g/dL (ref 30.0–36.0)
MCV: 88.5 fl (ref 78.0–100.0)
Monocytes Absolute: 0.4 10*3/uL (ref 0.1–1.0)
Monocytes Relative: 6.4 % (ref 3.0–12.0)
Neutro Abs: 3.2 10*3/uL (ref 1.4–7.7)
Neutrophils Relative %: 56.6 % (ref 43.0–77.0)
Platelets: 197 10*3/uL (ref 150.0–400.0)
RBC: 5.03 Mil/uL (ref 4.22–5.81)
RDW: 13 % (ref 11.5–15.5)
WBC: 5.6 10*3/uL (ref 4.0–10.5)

## 2021-02-20 LAB — BASIC METABOLIC PANEL
BUN: 13 mg/dL (ref 6–23)
CO2: 27 mEq/L (ref 19–32)
Calcium: 9.8 mg/dL (ref 8.4–10.5)
Chloride: 103 mEq/L (ref 96–112)
Creatinine, Ser: 1.17 mg/dL (ref 0.40–1.50)
GFR: 72.67 mL/min (ref >60.00)
Glucose, Bld: 106 mg/dL — ABNORMAL HIGH (ref 70–99)
Potassium: 4.2 mEq/L (ref 3.5–5.1)
Sodium: 139 mEq/L (ref 135–145)

## 2021-02-20 LAB — LIPID PANEL
Cholesterol: 159 mg/dL (ref 0–200)
HDL: 31.8 mg/dL — ABNORMAL LOW (ref >39.00)
LDL Cholesterol: 102 mg/dL — ABNORMAL HIGH (ref 0–99)
NonHDL: 127.16
Total CHOL/HDL Ratio: 5
Triglycerides: 126 mg/dL (ref 0.0–149.0)
VLDL: 25.2 mg/dL (ref 0.0–40.0)

## 2021-02-20 LAB — PSA: PSA: 0.62 ng/mL (ref 0.10–4.00)

## 2021-02-20 LAB — HEPATIC FUNCTION PANEL
ALT: 34 U/L (ref 0–53)
AST: 22 U/L (ref 0–37)
Albumin: 4.8 g/dL (ref 3.5–5.2)
Alkaline Phosphatase: 52 U/L (ref 39–117)
Bilirubin, Direct: 0.1 mg/dL (ref 0.0–0.3)
Total Bilirubin: 0.6 mg/dL (ref 0.2–1.2)
Total Protein: 7.1 g/dL (ref 6.0–8.3)

## 2021-02-20 LAB — TSH: TSH: 1.73 u[IU]/mL (ref 0.35–5.50)

## 2021-02-20 NOTE — Progress Notes (Signed)
Established Patient Office Visit  Subjective:  Patient ID: Lucas Murray, male    DOB: 11-20-1970  Age: 50 y.o. MRN: [id number]  CC:  Chief Complaint  Patient presents with   Annual Exam    Right heel is still bothering him    HPI Lucas Murray presents for physical exam.  He had some issues with right heel plantar fasciitis for really a year now.  He seen sports medicine.  He had ultrasound which showed stress reaction of the calcaneus.  Currently not exercising regularly.  He had colonoscopy recently in June with recommended 7-year follow-up.  He has history of splenic artery aneurysm followed by vascular surgery.  Previous history of small bowel obstruction and had surgery for that.  Partial resection of bowel.  Health maintenance reviewed  -Previous hepatitis C antibody negative -Tetanus due 2026 -Colonoscopy due 2029 -No history of shingles vaccine but just turned 22  Family history-mother with passed away this past year complications of pneumonia.  She apparently had latent onset type 1 diabetes of adulthood.  She also had history of hypertension, hyperlipidemia, and hypothyroidism.  He was separated from his father around age 32 and has not kept in touch with him.  No siblings.  Social history-he is married.  He has 82 year old son and 78-year-old daughter.  He works as an Forensic psychologist for [employer].  Non-smoker.  Past Medical History:  Diagnosis Date   Allergy    seasonal allergies   Chicken pox    Chronic kidney disease    GERD (gastroesophageal reflux disease)    hx of-with certain foods   Unilateral congenital absence of kidney    Urinary tract infection     Past Surgical History:  Procedure Laterality Date   COLON RESECTION N/A 07/01/2017   Procedure: LAPAROSCOPY, LAPAROTOMY WITH ILEO-CECECTOMY;  Surgeon: Excell Seltzer, MD;  Location: [facility];  Service: General;  Laterality: N/A;   FRENULECTOMY, LINGUAL N/A    age 8   VASECTOMY     [place]    Family History  Problem Relation Age of Onset   Arthritis Mother    Hyperlipidemia Mother    Hypertension Mother    Diabetes Mother        type 1.5   Hypothyroidism Mother    Arthritis Maternal Grandmother    Heart disease Maternal Grandfather    Cancer Maternal Grandfather        lung   Hypertension Maternal Grandfather    Colon polyps Neg Hx    Colon cancer Neg Hx    Esophageal cancer Neg Hx    Stomach cancer Neg Hx    Rectal cancer Neg Hx     Social History   Socioeconomic History   Marital status: Married    Spouse name: Not on file   Number of children: Not on file   Years of education: Not on file   Highest education level: Not on file  Occupational History   Not on file  Tobacco Use   Smoking status: Never   Smokeless tobacco: Never  Vaping Use   Vaping Use: Never used  Substance and Sexual Activity   Alcohol use: Yes    Alcohol/week: 2.0 standard drinks    Types: 2 Standard drinks or equivalent per week   Drug use: No   Sexual activity: Not on file  Other Topics Concern   Not on file  Social History Narrative   Not on file   Social Determinants of  Health   Financial Resource Strain: Not on file  Food Insecurity: Not on file  Transportation Needs: Not on file  Physical Activity: Not on file  Stress: Not on file  Social Connections: Not on file  Intimate Partner Violence: Not on file    No outpatient medications prior to visit.   Facility-Administered Medications Prior to Visit  Medication Dose Route Frequency Provider Last Rate Last Admin   0.9 %  sodium chloride infusion  500 mL Intravenous Once Armbruster, Carlota Raspberry, MD        Allergies  Allergen Reactions   E-Mycin [Erythromycin] Anaphylaxis   Penicillins     Has patient had a PCN reaction causing immediate rash, facial/tongue/throat swelling, SOB or lightheadedness with hypotension: No Has patient had a PCN reaction causing severe rash involving mucus membranes or  skin necrosis: No Has patient had a PCN reaction that required hospitalization: No Has patient had a PCN reaction occurring within the last 10 years: No If all of the above answers are "NO", then may proceed with Cephalosporin use.     ROS Review of Systems  Constitutional:  Negative for activity change, appetite change, fatigue and fever.  HENT:  Negative for congestion, ear pain and trouble swallowing.   Eyes:  Negative for pain and visual disturbance.  Respiratory:  Negative for cough, shortness of breath and wheezing.   Cardiovascular:  Negative for chest pain and palpitations.  Gastrointestinal:  Negative for abdominal distention, abdominal pain, blood in stool, constipation, diarrhea, nausea, rectal pain and vomiting.  Endocrine: Negative for polydipsia and polyuria.  Genitourinary:  Negative for dysuria, hematuria and testicular pain.  Musculoskeletal:  Negative for arthralgias and joint swelling.  Skin:  Negative for rash.  Neurological:  Negative for dizziness, syncope and headaches.  Hematological:  Negative for adenopathy.  Psychiatric/Behavioral:  Negative for confusion and dysphoric mood.      Objective:    Physical Exam Constitutional:      General: He is not in acute distress.    Appearance: He is well-developed.  HENT:     Head: Normocephalic and atraumatic.     Right Ear: External ear normal.     Left Ear: External ear normal.  Eyes:     Conjunctiva/sclera: Conjunctivae normal.     Pupils: Pupils are equal, round, and reactive to light.  Neck:     Thyroid: No thyromegaly.  Cardiovascular:     Rate and Rhythm: Normal rate and regular rhythm.     Heart sounds: Normal heart sounds. No murmur heard. Pulmonary:     Effort: No respiratory distress.     Breath sounds: No wheezing or rales.  Abdominal:     General: Bowel sounds are normal. There is no distension.     Palpations: Abdomen is soft. There is no mass.     Tenderness: There is no abdominal  tenderness. There is no guarding or rebound.  Musculoskeletal:     Cervical back: Normal range of motion and neck supple.  Lymphadenopathy:     Cervical: No cervical adenopathy.  Skin:    Findings: No rash.     Comments: He has some scattered seborrheic keratoses on the back but no concerning lesions.  Neurological:     Mental Status: He is alert and oriented to person, place, and time.     Cranial Nerves: No cranial nerve deficit.     Deep Tendon Reflexes: Reflexes normal.    BP 130/80 (BP Location: Left Arm, Patient Position: Sitting, Cuff Size:  Normal)   Pulse 75   Temp 98 F (36.7 C) (Oral)   Ht 6' (1.829 m)   Wt 219 lb 12.8 oz (99.7 kg)   SpO2 96%   BMI 29.81 kg/m  Wt Readings from Last 3 Encounters:  02/20/21 219 lb 12.8 oz (99.7 kg)  01/20/21 212 lb (96.2 kg)  12/26/20 212 lb (96.2 kg)     Health Maintenance Due  Topic Date Due   COVID-19 Vaccine (3 - Booster for Pfizer series) 04/21/2020   Zoster Vaccines- Shingrix (1 of 2) Never done    There are no preventive care reminders to display for this patient.  Lab Results  Component Value Date   TSH 1.80 02/19/2020   Lab Results  Component Value Date   WBC 5.9 02/19/2020   HGB 15.0 02/19/2020   HCT 41.9 02/19/2020   MCV 89.2 02/19/2020   PLT 200.0 02/19/2020   Lab Results  Component Value Date   NA 138 02/19/2020   K 3.9 02/19/2020   CO2 27 02/19/2020   GLUCOSE 102 (H) 02/19/2020   BUN 15 02/19/2020   CREATININE 1.05 02/19/2020   BILITOT 0.7 02/19/2020   ALKPHOS 48 02/19/2020   AST 24 02/19/2020   ALT 43 02/19/2020   PROT 6.6 02/19/2020   ALBUMIN 4.5 02/19/2020   CALCIUM 9.6 02/19/2020   ANIONGAP 9 07/04/2017   GFR 74.90 02/19/2020   Lab Results  Component Value Date   CHOL 151 02/19/2020   Lab Results  Component Value Date   HDL 32.50 (L) 02/19/2020   Lab Results  Component Value Date   LDLCALC 80 02/19/2020   Lab Results  Component Value Date   TRIG 193.0 (H) 02/19/2020   Lab  Results  Component Value Date   CHOLHDL 5 02/19/2020   No results found for: HGBA1C    Assessment & Plan:   Problem List Items Addressed This Visit   None Visit Diagnoses     Physical exam    -  Primary   Relevant Orders   Basic metabolic panel   Lipid panel   CBC with Differential/Platelet   TSH   Hepatic function panel   PSA     Patient has chronic problems as above.  Generally doing well at this time except for some ongoing plantar fasciitis heel pain.  -We have recommended establishing more consistent exercise and try to drop some weight -Consider Shingrix vaccine and he will check on insurance coverage -Obtain screening labs as above -Repeat colonoscopy 2029 -Tetanus 2026  No orders of the defined types were placed in this encounter.   Follow-up: No follow-ups on file.    Carolann Littler, MD

## 2021-02-20 NOTE — Patient Instructions (Addendum)
Consider Shingrix vaccine at some point this year- but might want to look at insurance coverage.

## 2021-05-17 IMAGING — CT CT CTA ABD/PEL W/CM AND/OR W/O CM
3 of 9 series · 11 of 46 positions shown, 17 images · IV contrast (iopamidol)
Comparison: 06/29/2017

CLINICAL DATA: 49-year-old male with history of splenic artery
aneurysm.

EXAM:
CTA ABDOMEN AND PELVIS WITH CONTRAST
TECHNIQUE: Multidetector CT imaging of the abdomen and pelvis was performed
using the standard protocol during bolus administration of
intravenous contrast. Multiplanar reconstructed images and MIPs were
obtained and reviewed to evaluate the vascular anatomy.
CONTRAST:  75mL MJDC6N-IMS IOPAMIDOL (MJDC6N-IMS) INJECTION 76%

[Series 10: cta arterial 2.00 bv36 s3 sag art st · axial · arterial · 0.69mm/px · z∈[+1175,+1273]mm · 3 of 233 slices shown (1 of 2)]
[im 25/233  soft-tissue]
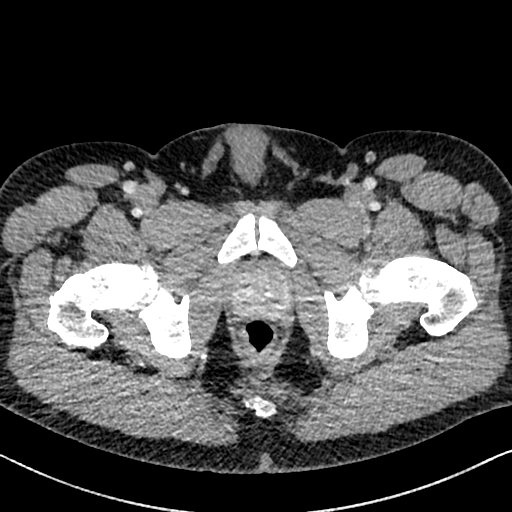
[im 49/233  soft-tissue]
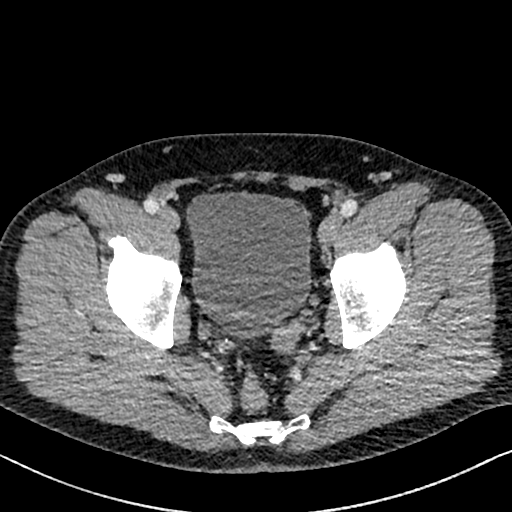
[im 74/233  soft-tissue]
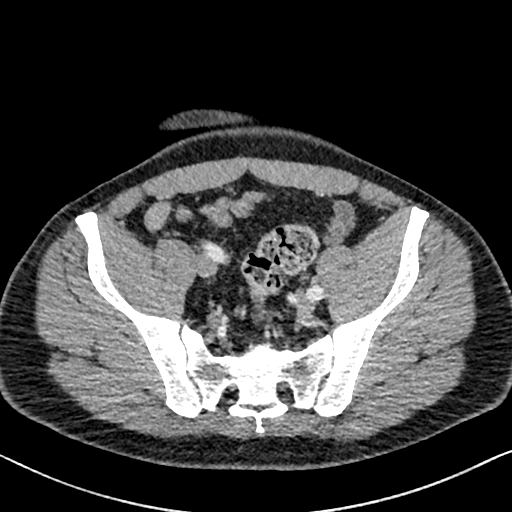

[Series 12: cta arterial 2.00 bv36 s3 sag art st · coronal · arterial · 0.69mm/px · 2 of 175 slices shown, 3 images (2 of 2)]
[im 59/175  soft-tissue]
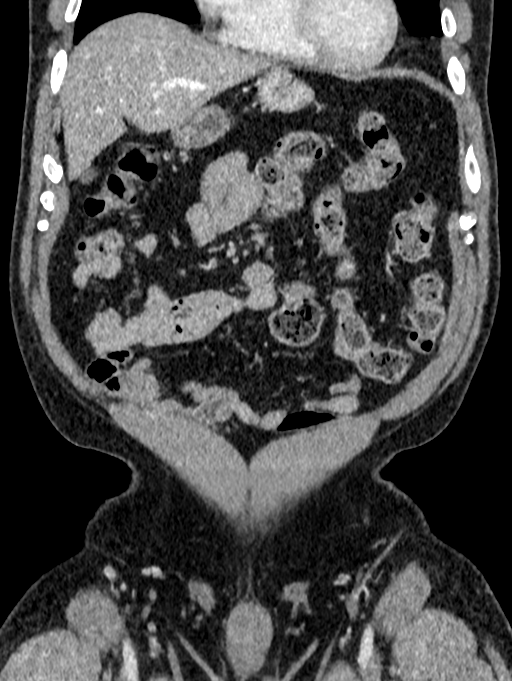
[im 59/175  bone]
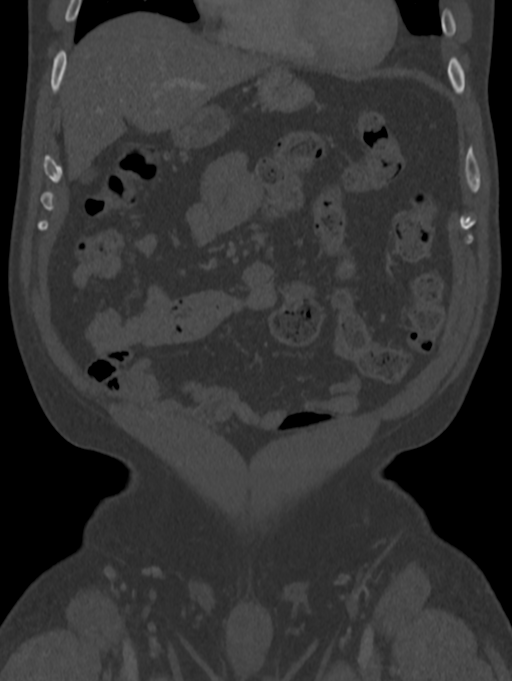
[im 117/175  soft-tissue]
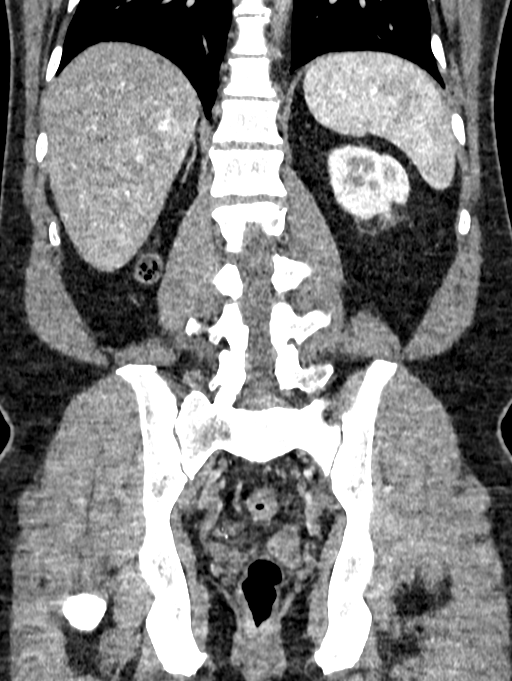

[Series 20: cta portal venous 5.00 bv36 s3 axial st · axial · portal-venous · 0.69mm/px · z∈[+1199,+1549]mm · 6 of 99 slices shown, 11 images]
[im 15/99  soft-tissue]
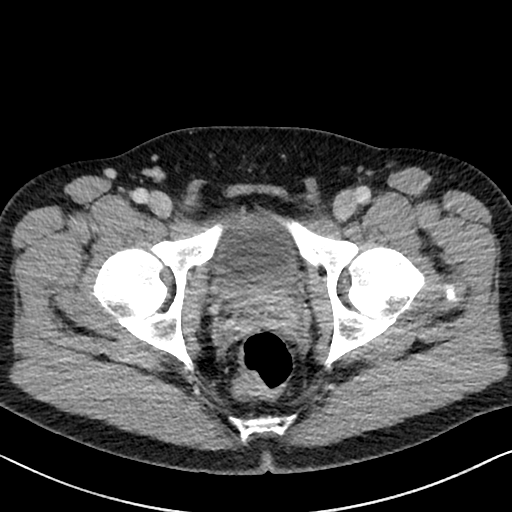
[im 15/99  bone]
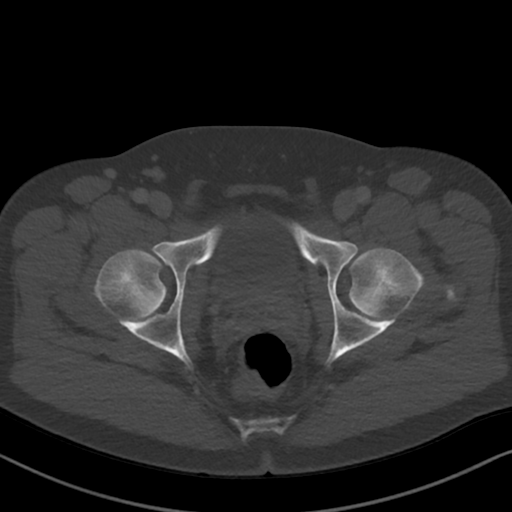
[im 29/99  soft-tissue]
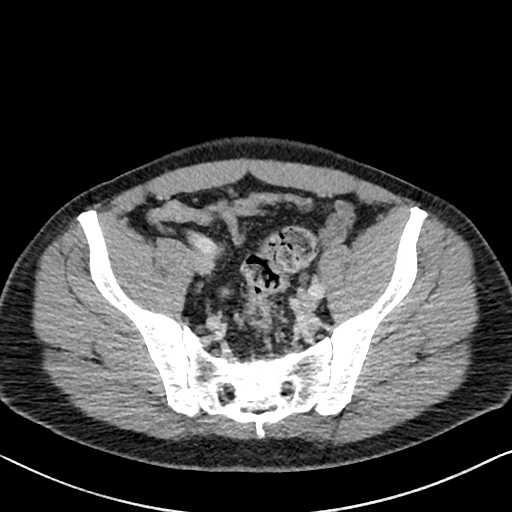
[im 43/99  soft-tissue]
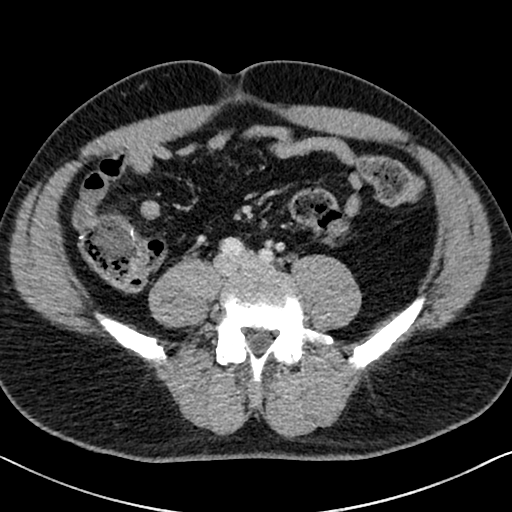
[im 43/99  lung]
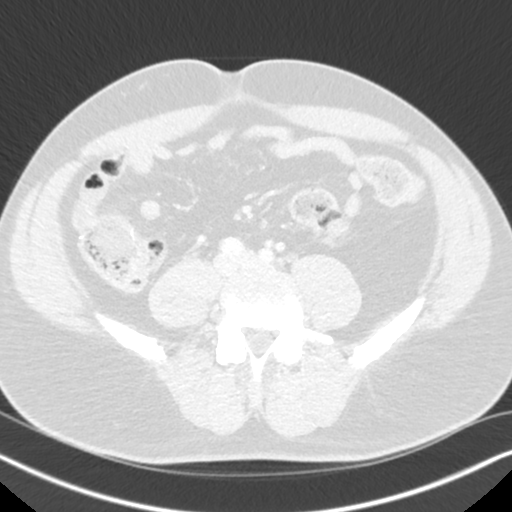
[im 57/99  soft-tissue]
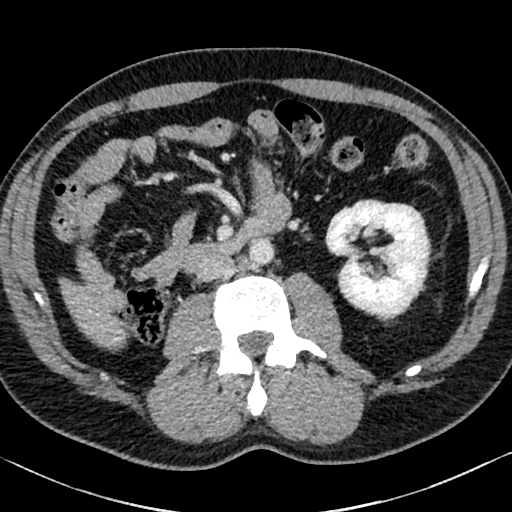
[im 57/99  lung]
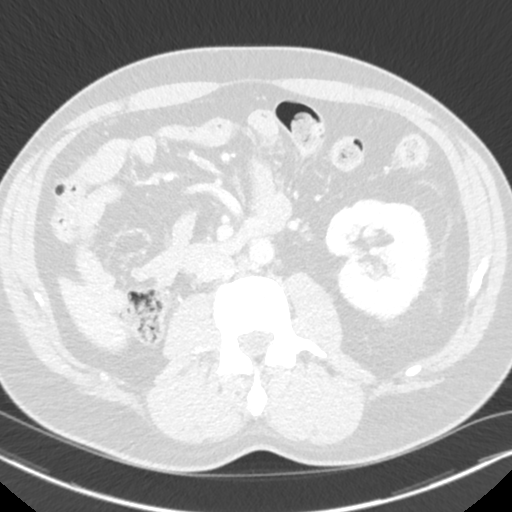
[im 71/99  soft-tissue]
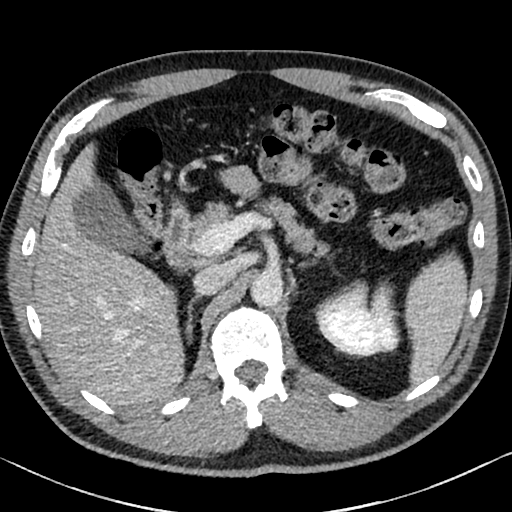
[im 71/99  lung]
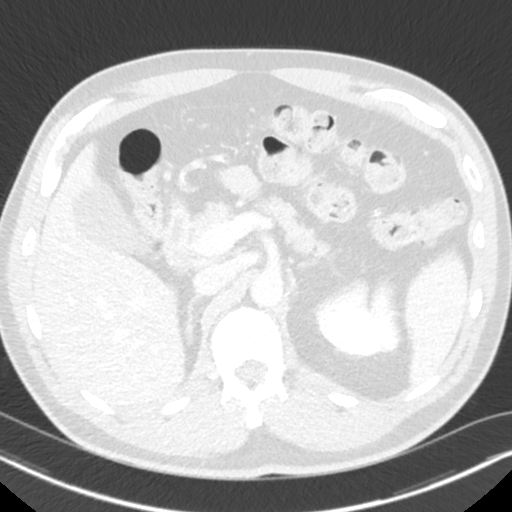
[im 85/99  soft-tissue]
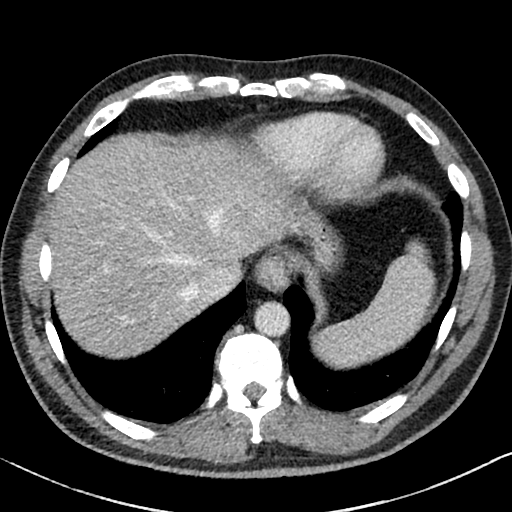
[im 85/99  lung]
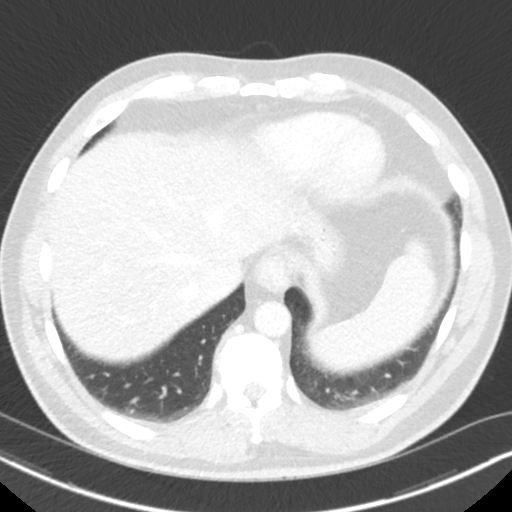

[11 of 46 positions shown; findings below may reference images not displayed]

FINDINGS: VASCULAR

Aorta: Normal caliber aorta without aneurysm, dissection, vasculitis
or significant stenosis.

Celiac: The celiac and proximal branches are patent. About the
distal main splenic artery, about the splenic hilum is a patent,
fusiform branch point aneurysm with scattered peripheral
calcification measuring approximately 2.3 x 1.6 x 1.6 cm unchanged
from comparison. The outflow branches appear patent.

SMA: Patent without evidence of aneurysm, dissection, vasculitis or
significant stenosis.

Renals: Dual left renal arteries are patent without evidence of
aneurysm, dissection, vasculitis, fibromuscular dysplasia or
significant stenosis.

IMA: Patent without evidence of aneurysm, dissection, vasculitis or
significant stenosis.

Inflow: Patent without evidence of aneurysm, dissection, vasculitis
or significant stenosis.

Proximal Outflow: Bilateral common femoral and visualized portions
of the superficial and profunda femoral arteries are patent without
evidence of aneurysm, dissection, vasculitis or significant
stenosis.

Veins: The hepatic veins are patent. The portal system is widely
patent. The left renal vein is patent. No evidence of iliocaval
anomalous configuration or thrombosis.

Review of the MIP images confirms the above findings.

NON-VASCULAR

Lower chest: No acute abnormality.

Hepatobiliary: No focal liver abnormality is seen. No gallstones,
gallbladder wall thickening, or biliary dilatation.

Pancreas: Unremarkable. No pancreatic ductal dilatation or
surrounding inflammatory changes.

Spleen: Normal in size without focal abnormality.

Adrenals/Urinary Tract: Adrenal glands are unremarkable. Solitary
left kidney is normal, without renal calculi, focal lesion, or
hydronephrosis. Bladder is unremarkable.

Stomach/Bowel: Stomach is within normal limits. The ligament of
Treitz is at midline and the majority of the small bowel is in the
right hemiabdomen. Postsurgical changes after ileocecectomy without
complication features. No evidence of bowel wall thickening,
distention, or inflammatory changes.

Lymphatic: No abdominopelvic lymphadenopathy.

Reproductive: Prostate is unremarkable. Congenital absence of the
right seminal vesicle.

Other: Tiny fat containing umbilical hernia. No abdominopelvic
ascites.

Musculoskeletal: No acute or significant osseous findings.
IMPRESSION: VASCULAR

Unchanged fusiform branch point aneurysm in the distal main splenic
artery which measures up to 2.3 cm.

NON-VASCULAR

1. Postsurgical changes after iliocecectomy without complicating
features.
2. Solitary left kidney.

## 2021-11-07 ENCOUNTER — Ambulatory Visit: Payer: No Typology Code available for payment source | Admitting: Family Medicine

## 2021-11-07 ENCOUNTER — Encounter: Payer: Self-pay | Admitting: Family Medicine

## 2021-11-07 VITALS — BP 140/100 | HR 72 | Temp 97.5°F | Ht 72.0 in | Wt 229.3 lb

## 2021-11-07 DIAGNOSIS — R1310 Dysphagia, unspecified: Secondary | ICD-10-CM | POA: Diagnosis not present

## 2021-11-07 DIAGNOSIS — M7581 Other shoulder lesions, right shoulder: Secondary | ICD-10-CM | POA: Diagnosis not present

## 2021-11-07 NOTE — Progress Notes (Signed)
? ?Established Patient Office Visit ? ?Subjective:  ?Patient ID: Lucas Murray, male    DOB: 24-Feb-1971  Age: 51 y.o. MRN: [id number] ? ?CC:  ?Chief Complaint  ?Patient presents with  ? Dysphagia  ?  X6 weeks  ? ? ?HPI ?Lucas Murray presents for 6-week history of some daily slightly progressive solid food dysphagia.  No history of similar symptoms.  No recent active GERD symptoms.  Good appetite.  Had some weight gain during the past year.  No pain with swallowing.  His symptoms are daily.  He has always been a finish meals but had 2 episodes recently where he had to regurgitate some food.  No difficulty with liquids.  No stool changes.  No abdominal pain.  Good appetite. ? ?Second issue is about 65-monthhistory of some right shoulder pain after doing some push-ups with his daughter.  Pain mostly with abduction greater than 90 degrees.  No cervical radiculitis symptoms.  No weakness.  Symptoms are relatively mild.  Not limiting day-to-day activities yet. ? ?Past Medical History:  ?Diagnosis Date  ? Allergy   ? seasonal allergies  ? Chicken pox   ? Chronic kidney disease   ? GERD (gastroesophageal reflux disease)   ? hx of-with certain foods  ? Unilateral congenital absence of kidney   ? Urinary tract infection   ? ? ?Past Surgical History:  ?Procedure Laterality Date  ? COLON RESECTION N/A 07/01/2017  ? Procedure: LAPAROSCOPY, LAPAROTOMY WITH ILEO-CECECTOMY;  Surgeon: HExcell Seltzer MD;  Location: [facility];  Service: General;  Laterality: N/A;  ? FRENULECTOMY, LINGUAL N/A   ? age 51 ? [place]    [place]  ? ? ?Family History  ?Problem Relation Age of Onset  ? Arthritis Mother   ? Hyperlipidemia Mother   ? Hypertension Mother   ? Diabetes Mother   ?     type 1.5  ? Hypothyroidism Mother   ? Arthritis Maternal Grandmother   ? Heart disease Maternal Grandfather   ? Cancer Maternal Grandfather   ?     lung  ? Hypertension Maternal Grandfather   ? Colon polyps Neg Hx   ? Colon cancer Neg Hx   ?  Esophageal cancer Neg Hx   ? Stomach cancer Neg Hx   ? Rectal cancer Neg Hx   ? ? ?Social History  ? ?Socioeconomic History  ? Marital status: Married  ?  Spouse name: Not on file  ? Number of children: Not on file  ? Years of education: Not on file  ? Highest education level: Not on file  ?Occupational History  ? Not on file  ?Tobacco Use  ? Smoking status: Never  ? Smokeless tobacco: Never  ?Vaping Use  ? Vaping Use: Never used  ?Substance and Sexual Activity  ? Alcohol use: Yes  ?  Alcohol/week: 2.0 standard drinks  ?  Types: 2 Standard drinks or equivalent per week  ? Drug use: No  ? Sexual activity: Not on file  ?Other Topics Concern  ? Not on file  ?Social History Narrative  ? Not on file  ? ?Social Determinants of Health  ? ?Financial Resource Strain: Not on file  ?Food Insecurity: Not on file  ?Transportation Needs: Not on file  ?Physical Activity: Not on file  ?Stress: Not on file  ?Social Connections: Not on file  ?Intimate Partner Violence: Not on file  ? ? ?No outpatient medications prior to visit.  ? ?Facility-Administered Medications Prior to  Visit  ?Medication Dose Route Frequency Provider Last Rate Last Admin  ? 0.9 %  sodium chloride infusion  500 mL Intravenous Once Armbruster, Carlota Raspberry, MD      ? ? ?Allergies  ?Allergen Reactions  ? E-Mycin [Erythromycin] Anaphylaxis  ? Penicillins   ?  Has patient had a PCN reaction causing immediate rash, facial/tongue/throat swelling, SOB or lightheadedness with hypotension: No ?Has patient had a PCN reaction causing severe rash involving mucus membranes or skin necrosis: No ?Has patient had a PCN reaction that required hospitalization: No ?Has patient had a PCN reaction occurring within the last 10 years: No ?If all of the above answers are "NO", then may proceed with Cephalosporin use. ?  ? ?Wt Readings from Last 3 Encounters:  ?11/07/21 229 lb 4.8 oz (104 kg)  ?02/20/21 219 lb 12.8 oz (99.7 kg)  ?01/20/21 212 lb (96.2 kg)  ? ? ? ?ROS ?Review of Systems   ?Constitutional:  Negative for appetite change, fever and unexpected weight change.  ?HENT:  Positive for trouble swallowing. Negative for sore throat.   ?Respiratory:  Negative for cough and shortness of breath.   ?Cardiovascular:  Negative for chest pain.  ?Gastrointestinal:  Negative for abdominal pain, diarrhea, nausea and vomiting.  ?Neurological:  Negative for weakness.  ? ?  ?Objective:  ?  ?Physical Exam ?Vitals reviewed.  ?Constitutional:   ?   Appearance: Normal appearance.  ?Cardiovascular:  ?   Rate and Rhythm: Normal rate and regular rhythm.  ?Pulmonary:  ?   Effort: Pulmonary effort is normal.  ?   Breath sounds: Normal breath sounds.  ?Abdominal:  ?   Palpations: Abdomen is soft.  ?   Tenderness: There is no abdominal tenderness.  ?Musculoskeletal:  ?   Cervical back: Neck supple.  ?   Comments: Right shoulder no localized tenderness.  Full range of motion.  He has pain with abduction greater than 90 degrees against resistance.  Minimal pain with internal rotation.  ?Lymphadenopathy:  ?   Cervical: No cervical adenopathy.  ?Neurological:  ?   Mental Status: He is alert.  ? ? ?BP (!) 140/100 (BP Location: Left Arm, Patient Position: Sitting, Cuff Size: Normal)   Pulse 72   Temp (!) 97.5 ?F (36.4 ?C) (Oral)   Ht 6' (1.829 m)   Wt 229 lb 4.8 oz (104 kg)   SpO2 97%   BMI 31.10 kg/m?  ?Wt Readings from Last 3 Encounters:  ?11/07/21 229 lb 4.8 oz (104 kg)  ?02/20/21 219 lb 12.8 oz (99.7 kg)  ?01/20/21 212 lb (96.2 kg)  ? ? ? ?Health Maintenance Due  ?Topic Date Due  ? COVID-19 Vaccine (3 - Booster for Pfizer series) 01/15/2020  ? Zoster Vaccines- Shingrix (1 of 2) Never done  ? ? ?There are no preventive care reminders to display for this patient. ? ?Lab Results  ?Component Value Date  ? TSH 1.73 02/20/2021  ? ?Lab Results  ?Component Value Date  ? WBC 5.6 02/20/2021  ? HGB 15.9 02/20/2021  ? HCT 44.5 02/20/2021  ? MCV 88.5 02/20/2021  ? PLT 197.0 02/20/2021  ? ?Lab Results  ?Component Value Date  ?  NA 139 02/20/2021  ? K 4.2 02/20/2021  ? CO2 27 02/20/2021  ? GLUCOSE 106 (H) 02/20/2021  ? BUN 13 02/20/2021  ? CREATININE 1.17 02/20/2021  ? BILITOT 0.6 02/20/2021  ? ALKPHOS 52 02/20/2021  ? AST 22 02/20/2021  ? ALT 34 02/20/2021  ? PROT 7.1 02/20/2021  ?  ALBUMIN 4.8 02/20/2021  ? CALCIUM 9.8 02/20/2021  ? ANIONGAP 9 07/04/2017  ? GFR 72.67 02/20/2021  ? ?Lab Results  ?Component Value Date  ? CHOL 159 02/20/2021  ? ?Lab Results  ?Component Value Date  ? HDL 31.80 (L) 02/20/2021  ? ?Lab Results  ?Component Value Date  ? LDLCALC 102 (H) 02/20/2021  ? ?Lab Results  ?Component Value Date  ? TRIG 126.0 02/20/2021  ? ?Lab Results  ?Component Value Date  ? CHOLHDL 5 02/20/2021  ? ?No results found for: HGBA1C ? ?  ?Assessment & Plan:  ? ?#1 6-week history of some solid food dysphagia.  Does not have any red flags such as appetite change or weight loss.  Consider trial of over-the-counter PPI with Nexium or Prilosec.  Set up GI referral for further evaluation ? ?#2 right shoulder pain.  Suspect rotator cuff tendinitis.  At this point symptoms are relatively mild.  Discussed importance of maintaining range of motion.  Discussed options of physical therapy or sports medicine referral at this point he wishes to observe. ? ? ?No orders of the defined types were placed in this encounter. ? ? ?Follow-up: No follow-ups on file.  ? ? ?Carolann Littler, MD ?

## 2021-11-28 ENCOUNTER — Ambulatory Visit (INDEPENDENT_AMBULATORY_CARE_PROVIDER_SITE_OTHER): Payer: No Typology Code available for payment source | Admitting: Physician Assistant

## 2021-11-28 ENCOUNTER — Encounter: Payer: Self-pay | Admitting: Physician Assistant

## 2021-11-28 VITALS — BP 138/72 | HR 81 | Ht 72.0 in | Wt 225.2 lb

## 2021-11-28 DIAGNOSIS — R490 Dysphonia: Secondary | ICD-10-CM | POA: Diagnosis not present

## 2021-11-28 DIAGNOSIS — R1314 Dysphagia, pharyngoesophageal phase: Secondary | ICD-10-CM

## 2021-11-28 MED ORDER — OMEPRAZOLE 40 MG PO CPDR: 40.0000 mg | DELAYED_RELEASE_CAPSULE | Freq: Every day | ORAL | 2 refills | Status: DC

## 2021-11-28 NOTE — Progress Notes (Signed)
Agree with assessment and plan as outlined.  

## 2021-11-28 NOTE — Progress Notes (Signed)
? ?Chief Complaint: Dysphagia ? ?HPI: ?   Lucas Murray is a 51 year old male with a past medical history as listed below including CKD, reflux and others, known to Dr. Havery Moros for a screening colonoscopy, who was referred to me by Eulas Post, MD for a complaint of dysphagia. ?   01/20/2021 colonoscopy with patent end-to-end ileocolonic anastomosis, one 4 mm polyp in the descending colon, significant looping and internal hemorrhoids.  Polyp was adenomatous.  Repeat recommended in 7 years. ?   Today, the patient presents to clinic and tells me that 3 months ago somewhat "out of the blue" he started with a feeling of something constantly in his throat and then has occasional issues when swallowing foods.  It does not seem to matter what it is, it just happens here and there multiple times throughout the week.  He does tell me that 1-2 times a week he will just all of a sudden notice that he has no feeling in his throat at all and it feels "normal", tells me that along with this he has been experiencing some hoarseness and feels like he has been more reactive to sugary foods noting some stomach discomfort and diarrhea if he eats something very sugary.  Describes a distant history of reflux 10 to 12 years ago and has not been on medication since then. ?   Denies fever, chills, nausea, vomiting or weight loss. ? ?Past Medical History:  ?Diagnosis Date  ? Allergy   ? seasonal allergies  ? Chicken pox   ? Chronic kidney disease   ? GERD (gastroesophageal reflux disease)   ? hx of-with certain foods  ? Unilateral congenital absence of kidney   ? Urinary tract infection   ? ? ?Past Surgical History:  ?Procedure Laterality Date  ? COLON RESECTION N/A 07/01/2017  ? Procedure: LAPAROSCOPY, LAPAROTOMY WITH ILEO-CECECTOMY;  Surgeon: Excell Seltzer, MD;  Location: [facility];  Service: General;  Laterality: N/A;  ? FRENULECTOMY, LINGUAL N/A   ? age 64  ? VASECTOMY    ? [place] EXTRACTION  1990  ? ? ?No current  outpatient medications on file.  ? ?Current Facility-Administered Medications  ?Medication Dose Route Frequency Provider Last Rate Last Admin  ? 0.9 %  sodium chloride infusion  500 mL Intravenous Once Armbruster, Carlota Raspberry, MD      ? ? ?Allergies as of 11/28/2021 - Review Complete 11/28/2021  ?Allergen Reaction Noted  ? E-mycin [erythromycin] Anaphylaxis 04/07/2013  ? Penicillins  04/07/2013  ? ? ?Family History  ?Problem Relation Age of Onset  ? Arthritis Mother   ? Hyperlipidemia Mother   ? Hypertension Mother   ? Diabetes Mother   ?     type 1.5  ? Hypothyroidism Mother   ? Arthritis Maternal Grandmother   ? Heart disease Maternal Grandfather   ? Cancer Maternal Grandfather   ?     lung  ? Hypertension Maternal Grandfather   ? Colon polyps Neg Hx   ? Colon cancer Neg Hx   ? Esophageal cancer Neg Hx   ? Stomach cancer Neg Hx   ? Rectal cancer Neg Hx   ? ? ?Social History  ? ?Socioeconomic History  ? Marital status: Married  ?  Spouse name: Not on file  ? Number of children: Not on file  ? Years of education: Not on file  ? Highest education level: Not on file  ?Occupational History  ? Not on file  ?Tobacco Use  ? Smoking status:  Never  ? Smokeless tobacco: Never  ?Vaping Use  ? Vaping Use: Never used  ?Substance and Sexual Activity  ? Alcohol use: Yes  ?  Alcohol/week: 2.0 standard drinks  ?  Types: 2 Standard drinks or equivalent per week  ? Drug use: No  ? Sexual activity: Not on file  ?Other Topics Concern  ? Not on file  ?Social History Narrative  ? Not on file  ? ?Social Determinants of Health  ? ?Financial Resource Strain: Not on file  ?Food Insecurity: Not on file  ?Transportation Needs: Not on file  ?Physical Activity: Not on file  ?Stress: Not on file  ?Social Connections: Not on file  ?Intimate Partner Violence: Not on file  ? ? ?Review of Systems:    ?Constitutional: No weight loss, fever or chills ?Cardiovascular: No chest pain ?Respiratory: No SOB  ?Gastrointestinal: See HPI and otherwise negative ? ?  Physical Exam:  ?Vital signs: ?BP 138/72   Pulse 81   Ht 6' (1.829 m)   Wt 225 lb 3.2 oz (102.2 kg)   BMI 30.54 kg/m?   ? ?Constitutional:   Pleasant Caucasian male appears to be in NAD, Well developed, Well nourished, alert and cooperative ?Respiratory: Respirations even and unlabored. Lungs clear to auscultation bilaterally.   No wheezes, crackles, or rhonchi.  ?Cardiovascular: Normal S1, S2. No MRG. Regular rate and rhythm. No peripheral edema, cyanosis or pallor.  ?Gastrointestinal:  Soft, nondistended, nontender. No rebound or guarding. Normal bowel sounds. No appreciable masses or hepatomegaly. ?Rectal:  Not performed.  ?Psychiatric: Oriented to person, place and time. Demonstrates good judgement and reason without abnormal affect or behaviors. ? ?RELEVANT LABS AND IMAGING: ?CBC ?   ?Component Value Date/Time  ? WBC 5.6 02/20/2021 0902  ? RBC 5.03 02/20/2021 0902  ? HGB 15.9 02/20/2021 0902  ? HCT 44.5 02/20/2021 0902  ? PLT 197.0 02/20/2021 0902  ? MCV 88.5 02/20/2021 0902  ? MCH 30.8 07/04/2017 0538  ? MCHC 35.7 02/20/2021 0902  ? RDW 13.0 02/20/2021 0902  ? LYMPHSABS 1.9 02/20/2021 0902  ? MONOABS 0.4 02/20/2021 0902  ? EOSABS 0.1 02/20/2021 0902  ? BASOSABS 0.1 02/20/2021 0902  ? ? ?CMP  ?   ?Component Value Date/Time  ? NA 139 02/20/2021 0902  ? K 4.2 02/20/2021 0902  ? CL 103 02/20/2021 0902  ? CO2 27 02/20/2021 0902  ? GLUCOSE 106 (H) 02/20/2021 0902  ? BUN 13 02/20/2021 0902  ? CREATININE 1.17 02/20/2021 0902  ? CREATININE 1.15 12/30/2014 0907  ? CALCIUM 9.8 02/20/2021 0902  ? PROT 7.1 02/20/2021 0902  ? ALBUMIN 4.8 02/20/2021 0902  ? AST 22 02/20/2021 0902  ? ALT 34 02/20/2021 0902  ? ALKPHOS 52 02/20/2021 0902  ? BILITOT 0.6 02/20/2021 0902  ? GFRNONAA >60 07/06/2017 0655  ? GFRAA >60 07/06/2017 0655  ? ? ?Assessment: ?1.  Dysphagia: With globus sensation and hoarseness, distant history of reflux, currently not on any medications; consider esophageal stricture +/- H. pylori ? ?Plan: ?1.   Scheduled patient for diagnostic EGD with likely dilation in the [place] with Dr. Havery Moros.  Did provide the patient a detailed list of risks for the procedure and he agrees to proceed. ?2.  Started the patient on Omeprazole 40 mg daily, 30-60 minutes before eating breakfast and dinner.  #60 with 3 refills. ?3.  Discussed antidysphagia measures with the patient. ?4.  Patient to follow in clinic per recommendations from Dr. Havery Moros after time of procedure. ? ?Ellouise Newer, PA-C ?  [facility] ?11/28/2021, 9:05 AM ? ?Cc: Eulas Post, MD  ?

## 2021-11-28 NOTE — Patient Instructions (Signed)
We have sent the following medications to your pharmacy for you to pick up at your convenience: ?Omeprazole 40 mg daily 30-60 minutes before breakfast.  ? ?You have been scheduled for an endoscopy. Please follow written instructions given to you at your visit today. ?If you use inhalers (even only as needed), please bring them with you on the day of your procedure. ? ?If you are age 52 or older, your body mass index should be between 23-30. Your Body mass index is 30.54 kg/m?Marland Kitchen If this is out of the aforementioned range listed, please consider follow up with your Primary Care Provider. ? ?If you are age 22 or younger, your body mass index should be between 19-25. Your Body mass index is 30.54 kg/m?Marland Kitchen If this is out of the aformentioned range listed, please consider follow up with your Primary Care Provider.  ? ?________________________________________________________ ? ?The [facility] providers would like to encourage you to use [facility] to communicate with providers for non-urgent requests or questions.  Due to long hold times on the telephone, sending your provider a message by [facility] may be a faster and more efficient way to get a response.  Please allow 48 business hours for a response.  Please remember that this is for non-urgent requests.  ?_______________________________________________________ ? ?

## 2021-12-04 ENCOUNTER — Encounter: Payer: Self-pay | Admitting: Gastroenterology

## 2021-12-04 ENCOUNTER — Ambulatory Visit (AMBULATORY_SURGERY_CENTER): Payer: No Typology Code available for payment source | Admitting: Gastroenterology

## 2021-12-04 VITALS — BP 122/81 | HR 63 | Temp 98.1°F | Resp 9 | Ht 72.0 in | Wt 225.0 lb

## 2021-12-04 DIAGNOSIS — K31A0 Gastric intestinal metaplasia, unspecified: Secondary | ICD-10-CM

## 2021-12-04 DIAGNOSIS — K2970 Gastritis, unspecified, without bleeding: Secondary | ICD-10-CM | POA: Diagnosis present

## 2021-12-04 DIAGNOSIS — R490 Dysphonia: Secondary | ICD-10-CM

## 2021-12-04 DIAGNOSIS — K2950 Unspecified chronic gastritis without bleeding: Secondary | ICD-10-CM

## 2021-12-04 DIAGNOSIS — R0989 Other specified symptoms and signs involving the circulatory and respiratory systems: Secondary | ICD-10-CM

## 2021-12-04 MED ORDER — SODIUM CHLORIDE 0.9 % IV SOLN: 500.0000 mL | Freq: Once | INTRAVENOUS | Status: DC

## 2021-12-04 NOTE — Op Note (Signed)
[place] ?Patient Name: Lucas Murray ?Procedure Date: 12/04/2021 11:58 AM ?MRN: [id number] ?Endoscopist: Carlota Raspberry. Havery Moros , MD ?Age: 51 ?Referring MD:  ?Date of Birth: [birth year] ?Gender: Male ?Account #: [id number] ?Procedure:                Upper GI endoscopy ?Indications:              Globus sensation - treated with omeprazole just  ?                          recently and perhaps some improvement, GERD  ?                          symptoms improved - no dysphagia ?Medicines:                Monitored Anesthesia Care ?Procedure:                Pre-Anesthesia Assessment: ?                          - Prior to the procedure, a History and Physical  ?                          was performed, and patient medications and  ?                          allergies were reviewed. The patient's tolerance of  ?                          previous anesthesia was also reviewed. The risks  ?                          and benefits of the procedure and the sedation  ?                          options and risks were discussed with the patient.  ?                          All questions were answered, and informed consent  ?                          was obtained. Prior Anticoagulants: The patient has  ?                          taken no previous anticoagulant or antiplatelet  ?                          agents. ASA Grade Assessment: II - A patient with  ?                          mild systemic disease. After reviewing the risks  ?                          and benefits, the patient was deemed in  ?  satisfactory condition to undergo the procedure. ?                          After obtaining informed consent, the endoscope was  ?                          passed under direct vision. Throughout the  ?                          procedure, the patient's blood pressure, pulse, and  ?                          oxygen saturations were monitored continuously. The  ?                          GIF [id number] #[id number] was  introduced through the  ?                          mouth, and advanced to the second part of duodenum.  ?                          The upper GI endoscopy was accomplished without  ?                          difficulty. The patient tolerated the procedure  ?                          well. ?Scope In: ?Scope Out: ?Findings:                 Esophagogastric landmarks were identified: the  ?                          Z-line was found at 32 cm, the gastroesophageal  ?                          junction was found at 38 cm and the upper extent of  ?                          the gastric folds was found at 40 cm from the  ?                          incisors. ?                          A 2 cm hiatal hernia was present. ?                          There were esophageal mucosal changes classified as  ?                          Barrett's stage C5-M6 per Prague criteria present  ?                          in the lower third  of the esophagus. The maximum  ?                          longitudinal extent of these mucosal changes was 6  ?                          cm in length. No nodularity. Mucosa was biopsied  ?                          with a cold forceps for histology. A total of 3  ?                          specimen bottles were sent to pathology. ?                          The exam of the esophagus was otherwise normal. ?                          The entire examined stomach was normal. ?                          The duodenal bulb and second portion of the  ?                          duodenum were normal. ?Complications:            No immediate complications. Estimated blood loss:  ?                          Minimal. ?Estimated Blood Loss:     Estimated blood loss was minimal. ?Impression:               - Esophagogastric landmarks identified. ?                          - 2 cm hiatal hernia. ?                          - Esophageal mucosal changes classified as  ?                          Barrett's stage C5-M6 per Prague criteria.  Biopsied. ?                          - Normal esophagus - no active esophagitis  ?                          appreciated ?                          - Normal stomach. ?                          - Normal duodenal bulb and second portion of the  ?  duodenum. ?                          Suspect longstanding underlying reflux, likely  ?                          driving globus symptoms recently. ?Recommendation:           - Patient has a contact number available for  ?                          emergencies. The signs and symptoms of potential  ?                          delayed complications were discussed with the  ?                          patient. Return to normal activities tomorrow.  ?                          Written discharge instructions were provided to the  ?                          patient. ?                          - Resume previous diet. ?                          - Continue present medications. ?                          - Await pathology results with further  ?                          recommendations ?                          - Continue with omeprazole '40mg'$  / day (just  ?                          recently started) ?Remo Lipps P. Angelene Rome, MD ?12/04/2021 12:28:32 PM ?This report has been signed electronically. ?

## 2021-12-04 NOTE — Progress Notes (Signed)
1159 Robinul 0.1 mg IV given due large amount of secretions upon assessment.  MD made aware, vss  ?

## 2021-12-04 NOTE — Patient Instructions (Signed)
Impression/Recommendations: ? ?Hiatal hernia handout given to patient. ? ?Resume previous diet. ?Continue present medications. ?Await pathology results with further recommendations. ? ?Continue Omeprazole 40 mg./day. ? ?YOU HAD AN ENDOSCOPIC PROCEDURE TODAY AT [facility]:   Refer to the procedure report that was given to you for any specific questions about what was found during the examination.  If the procedure report does not answer your questions, please call your gastroenterologist to clarify.  If you requested that your care partner not be given the details of your procedure findings, then the procedure report has been included in a sealed envelope for you to review at your convenience later. ? ?YOU SHOULD EXPECT: Some feelings of bloating in the abdomen. Passage of more gas than usual.  Walking can help get rid of the air that was put into your GI tract during the procedure and reduce the bloating. If you had a lower endoscopy (such as a colonoscopy or flexible sigmoidoscopy) you may notice spotting of blood in your stool or on the toilet paper. If you underwent a bowel prep for your procedure, you may not have a normal bowel movement for a few days. ? ?Please Note:  You might notice some irritation and congestion in your nose or some drainage.  This is from the oxygen used during your procedure.  There is no need for concern and it should clear up in a day or so. ? ?SYMPTOMS TO REPORT IMMEDIATELY: ?Following upper endoscopy (EGD) ? Vomiting of blood or coffee ground material ? New chest pain or pain under the shoulder blades ? Painful or persistently difficult swallowing ? New shortness of breath ? Fever of 100?F or higher ? Black, tarry-looking stools ? ?For urgent or emergent issues, a gastroenterologist can be reached at any hour by calling [phone]. ?Do not use MyChart messaging for urgent concerns.  ? ? ?DIET:  We do recommend a small meal at first, but then you may proceed to  your regular diet.  Drink plenty of fluids but you should avoid alcoholic beverages for 24 hours. ? ?ACTIVITY:  You should plan to take it easy for the rest of today and you should NOT DRIVE or use heavy machinery until tomorrow (because of the sedation medicines used during the test).   ? ?FOLLOW UP: ?Our staff will call the number listed on your records 48-72 hours following your procedure to check on you and address any questions or concerns that you may have regarding the information given to you following your procedure. If we do not reach you, we will leave a message.  We will attempt to reach you two times.  During this call, we will ask if you have developed any symptoms of COVID 19. If you develop any symptoms (ie: fever, flu-like symptoms, shortness of breath, cough etc.) before then, please call [phone].  If you test positive for Covid 19 in the 2 weeks post procedure, please call and report this information to [place].   ? ?If any biopsies were taken you will be contacted by phone or by letter within the next 1-3 weeks.  Please call us at [phone] if you have not heard about the biopsies in 3 weeks.  ? ? ?SIGNATURES/CONFIDENTIALITY: ?You and/or your care partner have signed paperwork which will be entered into your electronic medical record.  These signatures attest to the fact that that the information above on your After Visit Summary has been reviewed and is understood.  Full responsibility of the  confidentiality of this discharge information lies with you and/or your care-partner.  ?

## 2021-12-04 NOTE — Progress Notes (Signed)
Called to room to assist during endoscopic procedure.  Patient ID and intended procedure confirmed with present staff. Received instructions for my participation in the procedure from the performing physician.  

## 2021-12-04 NOTE — Progress Notes (Signed)
Report given to PACU, vss 

## 2021-12-04 NOTE — Progress Notes (Signed)
History and Physical Interval Note: Patient seen on 11/28/21 - symptoms mostly of globus, not so much dysphagia. On omeprazole trial which has helped somewhat but has only taken a few doses. Does have some heartburn which seems improved on meds. No prior EGD. No interval changes he wishes to proceed. ? ?12/04/2021 ?12:07 PM ? ?Lucas Murray  has presented today for endoscopic procedure(s), with the diagnosis of  ?Encounter Diagnoses  ?Name Primary?  ? Globus sensation Yes  ? Hoarseness   ?Marland Kitchen  The various methods of evaluation and treatment have been discussed with the patient and/or family. After consideration of risks, benefits and other options for treatment, the patient has consented to  the endoscopic procedure(s). ? ? The patient's history has been reviewed, patient examined, no change in status, stable for surgery.  I have reviewed the patient's chart and labs.  Questions were answered to the patient's satisfaction.   ? ?Jolly Mango, MD ?[facility] ? ?

## 2021-12-06 ENCOUNTER — Encounter: Payer: Self-pay | Admitting: Gastroenterology

## 2021-12-06 ENCOUNTER — Telehealth: Payer: Self-pay | Admitting: *Deleted

## 2021-12-06 ENCOUNTER — Telehealth: Payer: Self-pay

## 2021-12-06 NOTE — Telephone Encounter (Signed)
?  Follow up Call- ? ? ?  12/04/2021  ? 11:07 AM 01/20/2021  ?  8:15 AM  ?Call back number  ?Post procedure Call Back phone  # [phone]  ?Permission to leave phone message Yes Yes  ?  ? ?Patient questions: ? ?Do you have a fever, pain , or abdominal swelling? No. ?Pain Score  0 * ? ?Have you tolerated food without any problems? Yes.   ? ?Have you been able to return to your normal activities? Yes.   ? ?Do you have any questions about your discharge instructions: ?Diet   No. ?Medications  No. ?Follow up visit  No. ? ?Do you have questions or concerns about your Care? No. ? ?Actions: ?* If pain score is 4 or above: ?No action needed, pain <4. ? ? ?

## 2021-12-06 NOTE — Telephone Encounter (Signed)
?  Follow up Call- ? ? ?  12/04/2021  ? 11:07 AM 01/20/2021  ?  8:15 AM  ?Call back number  ?Post procedure Call Back phone  # [phone]  ?Permission to leave phone message Yes Yes  ? First follow up call, LVM. ?

## 2022-02-07 ENCOUNTER — Telehealth (INDEPENDENT_AMBULATORY_CARE_PROVIDER_SITE_OTHER): Payer: No Typology Code available for payment source | Admitting: Family Medicine

## 2022-02-07 ENCOUNTER — Encounter: Payer: Self-pay | Admitting: Family Medicine

## 2022-02-07 VITALS — Ht 72.0 in | Wt 225.0 lb

## 2022-02-07 DIAGNOSIS — M25511 Pain in right shoulder: Secondary | ICD-10-CM

## 2022-02-07 DIAGNOSIS — J209 Acute bronchitis, unspecified: Secondary | ICD-10-CM

## 2022-02-07 MED ORDER — HYDROCODONE BIT-HOMATROP MBR 5-1.5 MG/5ML PO SOLN: 5.0000 mL | Freq: Four times a day (QID) | ORAL | 0 refills | Status: DC | PRN

## 2022-02-07 NOTE — Progress Notes (Signed)
Patient ID: Nasim Habeeb, male   DOB: 1970/12/05, 51 y.o.   MRN: [id number]   Virtual Visit via Video Note  I connected with Francee Piccolo on 02/07/22 at  5:00 PM EDT by a video enabled telemedicine application and verified that I am speaking with the correct person using two identifiers.  Location patient: home Location provider:work or home office Persons participating in the virtual visit: patient, provider  I discussed the limitations of evaluation and management by telemedicine and the availability of in person appointments. The patient expressed understanding and agreed to proceed.   HPI:  Zakariyya called discussed the following issues:  Cough and sinus drainage for the past week or so.  Cough has become productive past couple days.  No fever.  No dyspnea.  No sick contacts.  Mild body aches.  Has some fatigue.  Has had severe cough especially at night.  Not relieved with Mucinex or Robitussin.  He thinks some of his fatigue may be due to poor sleep quality.  No wheezing.  No nausea or vomiting.  Right shoulder pain for several months.  First noticed after doing some push-ups.  Not improved with anti-inflammatories.  He would like to investigate this further at this time.  We had previously discussed possible sports medicine referral.  ROS: See pertinent positives and negatives per HPI.  Past Medical History:  Diagnosis Date   Allergy    seasonal allergies   Chicken pox    Chronic kidney disease    GERD (gastroesophageal reflux disease)    hx of-with certain foods   Unilateral congenital absence of kidney    Urinary tract infection     Past Surgical History:  Procedure Laterality Date   COLON RESECTION N/A 07/01/2017   Procedure: LAPAROSCOPY, LAPAROTOMY WITH ILEO-CECECTOMY;  Surgeon: Excell Seltzer, MD;  Location: [facility];  Service: General;  Laterality: N/A;   FRENULECTOMY, LINGUAL N/A    age 49   VASECTOMY     [place]    Family History  Problem  Relation Age of Onset   Arthritis Mother    Hyperlipidemia Mother    Hypertension Mother    Diabetes Mother        type 1.5   Hypothyroidism Mother    Arthritis Maternal Grandmother    Heart disease Maternal Grandfather    Cancer Maternal Grandfather        lung   Hypertension Maternal Grandfather    Colon polyps Neg Hx    Colon cancer Neg Hx    Esophageal cancer Neg Hx    Stomach cancer Neg Hx    Rectal cancer Neg Hx     SOCIAL HX: Non-smoker   Current Outpatient Medications:    HYDROcodone bit-homatropine (HYCODAN) 5-1.5 MG/5ML syrup, Take 5 mLs by mouth every 6 (six) hours as needed for cough., Disp: 120 mL, Rfl: 0   omeprazole (PRILOSEC) 40 MG capsule, Take 1 capsule (40 mg total) by mouth daily., Disp: 30 capsule, Rfl: 2  Current Facility-Administered Medications:    0.9 %  sodium chloride infusion, 500 mL, Intravenous, Once, Armbruster, Carlota Raspberry, MD   0.9 %  sodium chloride infusion, 500 mL, Intravenous, Once, Armbruster, Carlota Raspberry, MD  EXAM:  VITALS per patient if applicable:  GENERAL: alert, oriented, appears well and in no acute distress  HEENT: atraumatic, conjunttiva clear, no obvious abnormalities on inspection of external nose and ears  NECK: normal movements of the head and neck  LUNGS: on inspection no signs of  respiratory distress, breathing rate appears normal, no obvious gross SOB, gasping or wheezing  CV: no obvious cyanosis  MS: moves all visible extremities without noticeable abnormality  PSYCH/NEURO: pleasant and cooperative, no obvious depression or anxiety, speech and thought processing grossly intact  ASSESSMENT AND PLAN:  Discussed the following assessment and plan:  Acute bronchitis, unspecified organism  Right shoulder pain, unspecified chronicity - Plan: Ambulatory referral to Sports Medicine  -Hycodan cough syrup 1 teaspoon every 6 hours as needed for severe cough.  He is aware this may be sedating.  Plenty fluids and rest.   Follow-up promptly for any fever, dyspnea, or other concerns  -Setting up sports medicine referral to evaluate his several month history of progressive right shoulder pain   I discussed the assessment and treatment plan with the patient. The patient was provided an opportunity to ask questions and all were answered. The patient agreed with the plan and demonstrated an understanding of the instructions.   The patient was advised to call back or seek an in-person evaluation if the symptoms worsen or if the condition fails to improve as anticipated.     Carolann Littler, MD

## 2022-02-19 NOTE — Progress Notes (Unsigned)
    Lucas Murray D.[facility] Phone: [phone]   Assessment and Plan:     There are no diagnoses linked to this encounter.  ***   Pertinent previous records reviewed include ***   Follow Up: ***     Subjective:   I, Lucas Murray, am serving as a Education administrator for Doctor Glennon Mac  Chief Complaint: right shoulder pain   HPI:   02/20/2022  Patient is a 51 year old male complaining of right shoulder pain. Patient states  Relevant Historical Information: ***  Additional pertinent review of systems negative.   Current Outpatient Medications:    HYDROcodone bit-homatropine (HYCODAN) 5-1.5 MG/5ML syrup, Take 5 mLs by mouth every 6 (six) hours as needed for cough., Disp: 120 mL, Rfl: 0   omeprazole (PRILOSEC) 40 MG capsule, Take 1 capsule (40 mg total) by mouth daily., Disp: 30 capsule, Rfl: 2  Current Facility-Administered Medications:    0.9 %  sodium chloride infusion, 500 mL, Intravenous, Once, Armbruster, Carlota Raspberry, MD   0.9 %  sodium chloride infusion, 500 mL, Intravenous, Once, Armbruster, Carlota Raspberry, MD   Objective:     There were no vitals filed for this visit.    There is no height or weight on file to calculate BMI.    Physical Exam:    ***   Electronically signed by:  Lucas Murray D.[facility] 10:01 AM 02/19/22

## 2022-02-20 ENCOUNTER — Ambulatory Visit (INDEPENDENT_AMBULATORY_CARE_PROVIDER_SITE_OTHER): Payer: No Typology Code available for payment source | Admitting: Sports Medicine

## 2022-02-20 ENCOUNTER — Ambulatory Visit (INDEPENDENT_AMBULATORY_CARE_PROVIDER_SITE_OTHER): Payer: No Typology Code available for payment source

## 2022-02-20 VITALS — BP 110/60 | HR 82 | Ht 72.0 in | Wt 228.0 lb

## 2022-02-20 DIAGNOSIS — Q600 Renal agenesis, unilateral: Secondary | ICD-10-CM | POA: Diagnosis not present

## 2022-02-20 DIAGNOSIS — M25511 Pain in right shoulder: Secondary | ICD-10-CM

## 2022-02-20 DIAGNOSIS — M7581 Other shoulder lesions, right shoulder: Secondary | ICD-10-CM

## 2022-02-20 DIAGNOSIS — G8929 Other chronic pain: Secondary | ICD-10-CM

## 2022-02-20 MED ORDER — MELOXICAM 15 MG PO TABS: 15.0000 mg | ORAL_TABLET | Freq: Every day | ORAL | 0 refills | Status: DC

## 2022-02-20 NOTE — Patient Instructions (Addendum)
Good to see you  - Start meloxicam 15 mg daily x2 weeks.  If still having pain after 2 weeks, complete 3rd-week of meloxicam. May use remaining meloxicam as needed once daily for pain control.  Do not to use additional NSAIDs while taking meloxicam.  May use Tylenol [phone] mg 2 to 3 times a day for breakthrough pain. Rotator Cuff HEP Pt referral  4-5 weeks

## 2022-02-20 NOTE — Addendum Note (Signed)
Addended by: Pincus Badder R on: 02/20/2022 01:27 PM   Modules accepted: Orders

## 2022-02-24 ENCOUNTER — Other Ambulatory Visit: Payer: Self-pay | Admitting: Physician Assistant

## 2022-02-26 NOTE — Therapy (Signed)
OUTPATIENT PHYSICAL THERAPY CERVICAL EVALUATION   Patient Name: Lucas Murray MRN: [id number] DOB:Dec 15, 1970, 51 y.o., male Today's Date: 02/27/2022   PT End of Session - 02/27/22 1004     Visit Number 1    Date for PT Re-Evaluation 04/24/22    Authorization Type [employer]    PT Start Time 0841    PT Stop Time 0925    PT Time Calculation (min) 44 min    Activity Tolerance Patient tolerated treatment well    Behavior During Therapy WFL for tasks assessed/performed             Past Medical History:  Diagnosis Date   Allergy    seasonal allergies   Chicken pox    Chronic kidney disease    GERD (gastroesophageal reflux disease)    hx of-with certain foods   Unilateral congenital absence of kidney    Urinary tract infection    Past Surgical History:  Procedure Laterality Date   COLON RESECTION N/A 07/01/2017   Procedure: LAPAROSCOPY, LAPAROTOMY WITH ILEO-CECECTOMY;  Surgeon: Excell Seltzer, MD;  Location: [facility];  Service: General;  Laterality: N/A;   FRENULECTOMY, LINGUAL N/A    age 51   [place]   Patient Active Problem List   Diagnosis Date Noted   Congenital single kidney 02/20/2022   Stress fracture of foot 01/20/2020   Splenic artery aneurysm ([place]) 06/24/2018   Small bowel obstruction ([place]) 06/29/2017    PCP: Carolann Littler, MD  REFERRING PROVIDER: Glennon Mac, MD  REFERRING DIAG:  [id number] (ICD-10-CM) - Chronic right shoulder pain  M75.81 (ICD-10-CM) - Right rotator cuff tendonitis    THERAPY DIAG:  Chronic right shoulder pain - Plan: PT plan of care cert/re-cert  Muscle weakness (generalized) - Plan: PT plan of care cert/re-cert  Abnormal posture - Plan: PT plan of care cert/re-cert  Rationale for Evaluation and Treatment Rehabilitation  ONSET DATE: December 2022  SUBJECTIVE:                                                                                                                                                                                                          SUBJECTIVE STATEMENT: Pt is a Rt hand dominant male with Rt chronic shoulder pain.  Shoulder started aching in December 2022 and ~2 months ago it worsened without cause.  He reports that reaching with the Rt UE and felt extreme pain with this ~2 weeks ago.  Pt reports constant soreness and then increased pain with use.    PERTINENT  HISTORY:  NA  PAIN:  Are you having pain? Yes: NPRS scale: 1-8/10 Pain location: Rt shoulder into arms and into fingers  Pain description: aching, stabbing, tingling info fingers Aggravating factors: reaching, abduction, sleep on the Rt side  Relieving factors: rest, not reaching    PRECAUTIONS: None  WEIGHT BEARING RESTRICTIONS No  FALLS:  Has patient fallen in last 6 months? No  LIVING ENVIRONMENT: Lives with: lives with their family Lives in: House/apartment   OCCUPATION: desk work   PLOF: Independent  PATIENT GOALS reduce shoulder pain Using Rt UE 60%   OBJECTIVE:   DIAGNOSTIC FINDINGS:  Rt shoulder x-ray: Mild acromioclavicular and glenohumeral osteoarthritis.  PATIENT SURVEYS:  FOTO 32 (goal 72)   COGNITION: Overall cognitive status: Within functional limits for tasks assessed   SENSATION: WFL  POSTURE: rounded shoulders and forward head  PALPATION: Palpable tenderness over Rt distal AC joint and lateral deltoid    CERVICAL ROM:   WFLs with Rt UT pain with Lt sidebending.   UPPER EXTREMITY ROM: Rt=Lt UE A/ROM.  Pt with painful arc 120-140 into Rt flexion and abduction with pain at end range in overpressure.    UPPER EXTREMITY MMT:  MMT Right eval Left eval  Shoulder flexion 4+/5 5/5  Shoulder extension 5/5 5/5  Shoulder abduction 4/5 5/5  Shoulder adduction    Shoulder extension    Shoulder internal rotation 4+/5 5/5  Shoulder external rotation 4+/5 4+/5  Middle trapezius    Lower trapezius    Elbow flexion    Elbow extension     Wrist flexion    Wrist extension    Wrist ulnar deviation    Wrist radial deviation    Wrist pronation    Wrist supination    Grip strength     (Blank rows = not tested)  Empty can 4/5 on Rt  TODAY'S TREATMENT:  Date:  HEP established-see below     PATIENT EDUCATION:  Education details: [email or web address], posture education Person educated: Patient Education method: Explanation, Demonstration, and Handouts Education comprehension: verbalized understanding and returned demonstration   HOME EXERCISE PROGRAM: Access Code: [email or web address] URL: [email or web address] Date: 02/27/2022 Prepared by: Claiborne Billings  Exercises - Shoulder Internal Rotation with Resistance  - 2 x daily - 7 x weekly - 2 sets - 10 reps - Shoulder External Rotation with Anchored Resistance  - 2 x daily - 7 x weekly - 2 sets - 10 reps - Seated Correct Posture  - 1 x daily - 7 x weekly - 3 sets - 10 reps - Shoulder External Rotation and Scapular Retraction with Resistance  - 1 x daily - 7 x weekly - 3 sets - 10 reps - Seated Shoulder Empty Can with Dumbbells  - 2 x daily - 7 x weekly - 2 sets - 10 reps - Doorway Pec Stretch at 90 Degrees Abduction  - 2-3 x daily - 7 x weekly - 1 sets - 3 reps - 10-20 hold - Supine Thoracic Mobilization Towel Roll Vertical with Arm Stretch  - 2 x daily - 7 x weekly - 1 sets - 1 reps - 1-5 minutes  hold - Seated Shoulder Horizontal Abduction with Resistance - Palms Down  - 2 x daily - 7 x weekly - 2 sets - 10 reps ASSESSMENT:  CLINICAL IMPRESSION: Patient is a 51 y.o. male who was seen today for physical therapy evaluation and treatment for Rt shoulder pain. Pt has had pain since December and this has worsened over the past 2 months.  Recent imaging showed reduced AC joint space and mild degeneration in the glenohumeral joint.  Pt demonstrates poor seated posture with scapular protraction, has painful arc on the Rt with flexion and abduction at ~120-140 degrees of each and pain at end  range with overpressure at the [facility] joint.  Pt reports 2-8/10 Rt shoulder pain with use and pain increases with reaching out especially with lifting weight.  Patient will benefit from skilled PT to address the below impairments and improve overall function.    OBJECTIVE IMPAIRMENTS decreased activity tolerance, decreased strength, improper body mechanics, postural dysfunction, and pain.   ACTIVITY LIMITATIONS carrying, lifting, and reach over head  PARTICIPATION LIMITATIONS: laundry and yard work  PERSONAL FACTORS  none  are also affecting patient's functional outcome.   REHAB POTENTIAL: Good  CLINICAL DECISION MAKING: Stable/uncomplicated  EVALUATION COMPLEXITY: Low   GOALS: Goals reviewed with patient? Yes  SHORT TERM GOALS: Target date: 03/27/2022   Be independent in initial HEP Baseline:  Goal status: INITIAL  2.  Report > or = to 75% use of Rt UE for functional tasks due to reduced pain Baseline: 60%  Goal status: INITIAL  3.  Report postural corrections at home and work to improve scapular position at rest Baseline: rounded shoulder posture  Goal status: INITIAL  4.  Report > or = to 30% reduction in Rt shoulder pain with functional tasks  Baseline: 2-8/10 Goal status: INITIAL   LONG TERM GOALS: Target date: 04/24/2022  Be independent in advanced HEP Baseline:  Goal status: INITIAL  2.  Improve FOTO to > or = to 71 Baseline: 54 Goal status: INITIAL  3.  Report > or = to 90% use of Rt UE with functional tasks  Baseline:  Goal status: INITIAL  4.  Report > or = to 75% reduction in Rt UE pain with use  Baseline: 2-8/10 Goal status: INITIAL  5.  Demonstrate erect seated posture > 75% of the time without cueing and report postural corrections with desk work Baseline:  Goal status: INITIAL    PLAN: PT FREQUENCY: 1x/week likely biweekly  PT DURATION: 8 weeks  PLANNED INTERVENTIONS: Therapeutic exercises, Therapeutic activity, Neuromuscular re-education,  Balance training, Gait training, Patient/Family education, Self Care, Joint mobilization, Dry Needling, Electrical stimulation, Cryotherapy, Moist heat, Taping, Ionotophoresis '4mg'$ /ml Dexamethasone, and Manual therapy  PLAN FOR NEXT SESSION: review HEP, postural strength, pec flexibility    Sigurd Sos, PT 02/27/22 10:07 AM   [facility] [address] [place], [place] [address] Phone # [phone] Fax [phone]

## 2022-02-27 ENCOUNTER — Other Ambulatory Visit: Payer: Self-pay

## 2022-02-27 ENCOUNTER — Ambulatory Visit: Payer: No Typology Code available for payment source | Attending: Sports Medicine

## 2022-02-27 DIAGNOSIS — M7581 Other shoulder lesions, right shoulder: Secondary | ICD-10-CM | POA: Diagnosis not present

## 2022-02-27 DIAGNOSIS — G8929 Other chronic pain: Secondary | ICD-10-CM | POA: Diagnosis not present

## 2022-02-27 DIAGNOSIS — M25511 Pain in right shoulder: Secondary | ICD-10-CM | POA: Insufficient documentation

## 2022-02-27 DIAGNOSIS — R293 Abnormal posture: Secondary | ICD-10-CM

## 2022-02-27 DIAGNOSIS — M6281 Muscle weakness (generalized): Secondary | ICD-10-CM

## 2022-03-05 ENCOUNTER — Encounter: Payer: Self-pay | Admitting: Gastroenterology

## 2022-03-06 ENCOUNTER — Other Ambulatory Visit: Payer: Self-pay | Admitting: Gastroenterology

## 2022-03-06 ENCOUNTER — Other Ambulatory Visit (INDEPENDENT_AMBULATORY_CARE_PROVIDER_SITE_OTHER): Payer: No Typology Code available for payment source

## 2022-03-06 ENCOUNTER — Ambulatory Visit (INDEPENDENT_AMBULATORY_CARE_PROVIDER_SITE_OTHER): Payer: No Typology Code available for payment source | Admitting: Gastroenterology

## 2022-03-06 ENCOUNTER — Encounter: Payer: Self-pay | Admitting: Gastroenterology

## 2022-03-06 VITALS — BP 118/88 | HR 79 | Ht 72.0 in | Wt 227.0 lb

## 2022-03-06 DIAGNOSIS — Z79899 Other long term (current) drug therapy: Secondary | ICD-10-CM

## 2022-03-06 DIAGNOSIS — R0989 Other specified symptoms and signs involving the circulatory and respiratory systems: Secondary | ICD-10-CM | POA: Diagnosis not present

## 2022-03-06 DIAGNOSIS — K2270 Barrett's esophagus without dysplasia: Secondary | ICD-10-CM

## 2022-03-06 LAB — BASIC METABOLIC PANEL
BUN: 11 mg/dL (ref 6–23)
CO2: 27 mEq/L (ref 19–32)
Calcium: 10 mg/dL (ref 8.4–10.5)
Chloride: 102 mEq/L (ref 96–112)
Creatinine, Ser: 1.2 mg/dL (ref 0.40–1.50)
GFR: 69.98 mL/min (ref >60.00)
Glucose, Bld: 93 mg/dL (ref 70–99)
Potassium: 4 mEq/L (ref 3.5–5.1)
Sodium: 137 mEq/L (ref 135–145)

## 2022-03-06 MED ORDER — OMEPRAZOLE 40 MG PO CPDR: DELAYED_RELEASE_CAPSULE | ORAL | 0 refills | Status: DC

## 2022-03-06 NOTE — Progress Notes (Signed)
HPI :  51 year old male here for follow-up visit for recently diagnosed Barrett's and globus.  Recall the patient has had some longstanding globus and hoarseness over time.  Has not really had any baseline pyrosis or reflux symptoms otherwise that had bothered him.  He underwent an EGD with me in April for persistent globus and rare dysphagia.  Remarkable findings included a 2 cm hiatal hernia and a 6 cm segment of Barrett's esophagus, new diagnosis.  No dysplasia on biopsies.  He has been on omeprazole 40 mg once daily.  In general his globus has significantly improved on the regimen but it took about 4 to 5 weeks to really make a difference.  He states it is improved at this time although not entirely resolved.  He denies any pyrosis, water brash, chest discomfort otherwise.  He endorses very rare dysphagia which he thinks is due to esophageal spasm when he eats quickly.  Denies any nausea or vomiting.  He does drink Diet Coke daily and a lot of it, he is planning on quitting this soon.  Of note he has 1 kidney, we discussed long-term risks of chronic PPI use.  We discussed plan moving forward.   Colonoscopy 01/20/2021 - patent end-to-end ileocolonic anastomosis, one 4 mm polyp in the descending colon, significant looping and internal hemorrhoids.  Polyp was adenomatous.  Repeat recommended in 7 years.  EGD 12/04/21:  2 cm hiatal hernia. - Esophageal mucosal changes classified as Barrett's stage C5-M6 per [place] criteria. Biopsied. - Normal esophagus - no active esophagitis appreciated - Normal stomach. - Normal duodenal bulb and second portion of the duodenum.   1. Surgical [P], esophageal biopsy 38-36 CARDIA TYPE GASTRIC MUCOSA SHOWING MILD CHRONIC GASTRITIS WITH INTESTINAL METAPLASIA NEGATIVE FOR SQUAMOUS EPITHELIUM NEGATIVE FOR DYSPLASIA AND CARCINOMA (SEE MICROSCOPIC COMMENT) 2. Surgical [P], esophageal biopsy 36-34 CARDIA TYPE GASTRIC MUCOSA WITH MINIMAL CHRONIC GASTRITIS AND  INTESTINAL METAPLASIA SCANT DETACHED FRAGMENT OF REACTIVE SQUAMOUS EPITHELIUM NEGATIVE FOR DYSPLASIA AND CARCINOMA (SEE MICROSCOPIC COMMENT) 3. Surgical [P], esophageal biopsy 34-32 CARDIA TYPE GASTRIC MUCOSA WITH MINIMAL CHRONIC GASTRITIS AND EXTENSIVE INTESTINAL METAPLASIA NEGATIVE FOR DYSPLASIA AND CARCINOMA (SEE MICROSCOPIC COMMENT)   Past Medical History:  Diagnosis Date   Allergy    seasonal allergies   Barrett's esophagus    Chicken pox    Chronic kidney disease    GERD (gastroesophageal reflux disease)    hx of-with certain foods   Unilateral congenital absence of kidney    Urinary tract infection      Past Surgical History:  Procedure Laterality Date   COLON RESECTION N/A 07/01/2017   Procedure: LAPAROSCOPY, LAPAROTOMY WITH ILEO-CECECTOMY;  Surgeon: Excell Seltzer, MD;  Location: [facility];  Service: General;  Laterality: N/A;   FRENULECTOMY, LINGUAL N/A    age 52   VASECTOMY     [place]   Family History  Problem Relation Age of Onset   Arthritis Mother    Hyperlipidemia Mother    Hypertension Mother    Diabetes Mother        type 1.5   Hypothyroidism Mother    Arthritis Maternal Grandmother    Heart disease Maternal Grandfather    Cancer Maternal Grandfather        lung   Hypertension Maternal Grandfather    Colon polyps Neg Hx    Colon cancer Neg Hx    Esophageal cancer Neg Hx    Stomach cancer Neg Hx    Rectal cancer Neg Hx  Social History   Tobacco Use   Smoking status: Never   Smokeless tobacco: Never  Vaping Use   Vaping Use: Never used  Substance Use Topics   Alcohol use: Yes    Alcohol/week: 2.0 standard drinks of alcohol    Types: 2 Standard drinks or equivalent per week   Drug use: No   Current Outpatient Medications  Medication Sig Dispense Refill   omeprazole (PRILOSEC) 40 MG capsule Take once to twice daily 90 capsule 0   No current facility-administered medications for this visit.   Allergies   Allergen Reactions   E-Mycin [Erythromycin] Anaphylaxis   Penicillins     Has patient had a PCN reaction causing immediate rash, facial/tongue/throat swelling, SOB or lightheadedness with hypotension: No Has patient had a PCN reaction causing severe rash involving mucus membranes or skin necrosis: No Has patient had a PCN reaction that required hospitalization: No Has patient had a PCN reaction occurring within the last 10 years: No If all of the above answers are "NO", then may proceed with Cephalosporin use.      Review of Systems: All systems reviewed and negative except where noted in HPI.    DG Shoulder Right  Result Date: 02/21/2022 CLINICAL DATA:  Right shoulder pain for months. Worse for 6 weeks. Noticed after pushups. EXAM: RIGHT SHOULDER - 2+ VIEW COMPARISON:  None Available. FINDINGS: Mild acromioclavicular joint space narrowing. Minimal inferior and posterior glenoid degenerative osteophytosis. No acute fracture or dislocation. The visualized portion of the right lung is unremarkable. IMPRESSION: Mild acromioclavicular and glenohumeral osteoarthritis. Electronically Signed   By: Yvonne Kendall M.D.   On: 02/21/2022 17:40    Physical Exam: BP 118/88   Pulse 79   Ht 6' (1.829 m)   Wt 227 lb (103 kg)   SpO2 97%   BMI 30.79 kg/m  Constitutional: Pleasant,well-developed, male in no acute distress. Skin: Skin is warm and dry. No rashes noted. Psychiatric: Normal mood and affect. Behavior is normal.   ASSESSMENT AND PLAN: 51 year old male here for reassessment of the following:  Barrett's esophagus - long segment without dysplasia Globus Long-term PPI use  As above, relatively new diagnosis of long segment Barrett's esophagus without dysplasia.  Reflux is almost certainly causing his globus symptoms.  He has had significant improvement with 40 mg of omeprazole per day, but has not completely resolved yet.  Otherwise no other significant reflux symptoms.  We discussed  what Barrett's esophagus is, long-term risks for esophageal cancer.  Hopefully risk for this is low if he stays on PPI.  I do recommend indefinite PPI use at this time, using lowest dose needed to control symptoms.  He still does have some globus albeit improved, we discussed if he wanted to try twice daily dosing for a few weeks to see if that would provide any further benefit.  Of note he has only 1 kidney although it is functioning normally.  We discussed long-term risks of chronic PPI use, which can include increased risk for CKD.  We will check his kidney function today to make sure stable and he should have this checked once yearly moving forward if he needs to stay on chronic PPI.  He understands the risks of chronic PPI and agrees that benefits probably outweigh risks at this point time given long segment of Barrett's.  He is next due for surveillance EGD in April 2026 to surveilled this, or sooner if he develops any new or concerning symptoms.  He will follow-up with me  once yearly for this otherwise.  He agrees  Plan: - continue omeprazole '40mg'$  / day - can try BID dosing for a few weeks to see if any further improvement. Long term recommend lowest daily dosing needed to control symptoms - discussed risks / benefits of chronic / indefinite PPI use which is recommended in his case - he has one kidney - repeat BMET today to make sure stable - surveillance EGD 11/2024 - f/u once yearly  Jolly Mango, MD [facility]

## 2022-03-06 NOTE — Patient Instructions (Addendum)
If you are age 52 or older, your body mass index should be between 23-30. Your Body mass index is 30.79 kg/m. If this is out of the aforementioned range listed, please consider follow up with your Primary Care Provider.  If you are age 37 or younger, your body mass index should be between 19-25. Your Body mass index is 30.79 kg/m. If this is out of the aformentioned range listed, please consider follow up with your Primary Care Provider.   ________________________________________________________  The [facility] providers would like to encourage you to use [facility] to communicate with providers for non-urgent requests or questions.  Due to long hold times on the telephone, sending your provider a message by [facility] may be a faster and more efficient way to get a response.  Please allow 48 business hours for a response.  Please remember that this is for non-urgent requests.  _______________________________________________________  Please go to the lab in the basement of our building to have lab work done as you leave today. Hit "B" for basement when you get on the elevator.  When the doors open the lab is on your left.  We will call you with the results. Thank you.   You will be due for a recall EGD in 11-2024. We will send you a reminder in the mail when it gets closer to that time.  Continue omeprazole 40 mg once daily.  You can try twice a day dosing for 2 weeks and see if that improves your symptoms.   Please follow up in 1 year.  Thank you for entrusting me with your care and for choosing [facility], Dr. Primera Cellar

## 2022-03-07 MED ORDER — OMEPRAZOLE 40 MG PO CPDR: DELAYED_RELEASE_CAPSULE | ORAL | 1 refills | Status: DC

## 2022-03-19 ENCOUNTER — Other Ambulatory Visit: Payer: Self-pay | Admitting: Sports Medicine

## 2022-03-19 ENCOUNTER — Ambulatory Visit: Payer: No Typology Code available for payment source | Attending: Sports Medicine

## 2022-03-19 DIAGNOSIS — R293 Abnormal posture: Secondary | ICD-10-CM | POA: Diagnosis present

## 2022-03-19 DIAGNOSIS — M25511 Pain in right shoulder: Secondary | ICD-10-CM | POA: Insufficient documentation

## 2022-03-19 DIAGNOSIS — G8929 Other chronic pain: Secondary | ICD-10-CM | POA: Diagnosis present

## 2022-03-19 DIAGNOSIS — M6281 Muscle weakness (generalized): Secondary | ICD-10-CM | POA: Insufficient documentation

## 2022-03-19 NOTE — Therapy (Addendum)
OUTPATIENT PHYSICAL THERAPY TREATMENT   Patient Name: Lucas Murray MRN: [id number] DOB:1971/04/17, 51 y.o., male Today's Date: 03/19/2022   PT End of Session - 03/19/22 1221     Visit Number 2    Date for PT Re-Evaluation 04/24/22    Authorization Type [employer]    PT Start Time 1146    PT Stop Time 1214    PT Time Calculation (min) 28 min    Activity Tolerance Patient tolerated treatment well    Behavior During Therapy WFL for tasks assessed/performed              Past Medical History:  Diagnosis Date   Allergy    seasonal allergies   Barrett's esophagus    Chicken pox    Chronic kidney disease    GERD (gastroesophageal reflux disease)    hx of-with certain foods   Unilateral congenital absence of kidney    Urinary tract infection    Past Surgical History:  Procedure Laterality Date   COLON RESECTION N/A 07/01/2017   Procedure: LAPAROSCOPY, LAPAROTOMY WITH ILEO-CECECTOMY;  Surgeon: Excell Seltzer, MD;  Location: [facility];  Service: General;  Laterality: N/A;   FRENULECTOMY, LINGUAL N/A    age 51   [place]   Patient Active Problem List   Diagnosis Date Noted   Congenital single kidney 02/20/2022   Stress fracture of foot 01/20/2020   Splenic artery aneurysm ([place]) 06/24/2018   Small bowel obstruction ([place]) 06/29/2017    PCP: Carolann Littler, MD  REFERRING PROVIDER: Glennon Mac, MD  REFERRING DIAG:  [phone] (ICD-10-CM) - Chronic right shoulder pain  M75.81 (ICD-10-CM) - Right rotator cuff tendonitis    THERAPY DIAG:  Chronic right shoulder pain  Muscle weakness (generalized)  Abnormal posture  Rationale for Evaluation and Treatment Rehabilitation  ONSET DATE: December 2022  SUBJECTIVE:                                                                                                                                                                                                         SUBJECTIVE  STATEMENT: I am trying to correct my posture at home. Less frequent and less in intense pain.     PERTINENT HISTORY:  NA  PAIN:  Are you having pain? Yes: NPRS scale: 1-8/10 Pain location: Rt shoulder into arms and into fingers  Pain description: aching, stabbing, tingling info fingers Aggravating factors: reaching, abduction, sleep on the Rt side  Relieving factors: rest, not reaching    PRECAUTIONS: None  WEIGHT BEARING RESTRICTIONS  No  FALLS:  Has patient fallen in last 6 months? No  LIVING ENVIRONMENT: Lives with: lives with their family Lives in: House/apartment   OCCUPATION: desk work   PLOF: Independent  PATIENT GOALS reduce shoulder pain Using Rt UE 60%   OBJECTIVE:   DIAGNOSTIC FINDINGS:  Rt shoulder x-ray: Mild acromioclavicular and glenohumeral osteoarthritis.  PATIENT SURVEYS:  FOTO 97 (goal 77)   COGNITION: Overall cognitive status: Within functional limits for tasks assessed   SENSATION: WFL  POSTURE: rounded shoulders and forward head  PALPATION: Palpable tenderness over Rt distal AC joint and lateral deltoid    CERVICAL ROM:   WFLs with Rt UT pain with Lt sidebending.   UPPER EXTREMITY ROM: Rt=Lt UE A/ROM.  Pt with painful arc 120-140 into Rt flexion and abduction with pain at end range in overpressure.    UPPER EXTREMITY MMT:  MMT Right eval Left eval  Shoulder flexion 4+/5 5/5  Shoulder extension 5/5 5/5  Shoulder abduction 4/5 5/5  Shoulder adduction    Shoulder extension    Shoulder internal rotation 4+/5 5/5  Shoulder external rotation 4+/5 4+/5  Middle trapezius    Lower trapezius    Elbow flexion    Elbow extension    Wrist flexion    Wrist extension    Wrist ulnar deviation    Wrist radial deviation    Wrist pronation    Wrist supination    Grip strength     (Blank rows = not tested)  Empty can 4/5 on Rt  TODAY'S TREATMENT:   Date:  03/19/22 Shoulder IR/ER with blue band x20 ER and horizontal abduction  with blue band 2x10 Empty can 2# added x10 Shoulder abduction x10 without weight Pec stretch in doorway- cueing to reduce intensity of stretch  Date:  02/27/22 HEP established-see below   PATIENT EDUCATION:  Education details: [email or web address], posture education Person educated: Patient Education method: Explanation, Demonstration, and Handouts Education comprehension: verbalized understanding and returned demonstration   HOME EXERCISE PROGRAM: Access Code: [email or web address] URL: [email or web address] Date: 02/27/2022 Prepared by: Claiborne Billings  Exercises - Shoulder Internal Rotation with Resistance  - 2 x daily - 7 x weekly - 2 sets - 10 reps - Shoulder External Rotation with Anchored Resistance  - 2 x daily - 7 x weekly - 2 sets - 10 reps - Seated Correct Posture  - 1 x daily - 7 x weekly - 3 sets - 10 reps - Shoulder External Rotation and Scapular Retraction with Resistance  - 1 x daily - 7 x weekly - 3 sets - 10 reps - Seated Shoulder Empty Can with Dumbbells  - 2 x daily - 7 x weekly - 2 sets - 10 reps - Doorway Pec Stretch at 90 Degrees Abduction  - 2-3 x daily - 7 x weekly - 1 sets - 3 reps - 10-20 hold - Supine Thoracic Mobilization Towel Roll Vertical with Arm Stretch  - 2 x daily - 7 x weekly - 1 sets - 1 reps - 1-5 minutes  hold - Seated Shoulder Horizontal Abduction with Resistance - Palms Down  - 2 x daily - 7 x weekly - 2 sets - 10 reps  ASSESSMENT:  CLINICAL IMPRESSION: Pt has improvement in Rt shoulder pain and functional use. Pt reports 90% use of Rt UE, improved from 60% 3 weeks ago.  Pain is less frequent and intense overall.  FOTO score is improved to 64 from 54, indicating improved function. Pt no longer experiences a painful arc with  forward flexion.  PT added shoulder abduction without weight to HEP as this is painful at 90 degrees.  Blue band issued for advancement of exercises.  Postural awareness is improved and pt is making corrections at home and with work at desk.   PT will hold chart x 2 weeks.    OBJECTIVE IMPAIRMENTS decreased activity tolerance, decreased strength, improper body mechanics, postural dysfunction, and pain.   ACTIVITY LIMITATIONS carrying, lifting, and reach over head  PARTICIPATION LIMITATIONS: laundry and yard work  PERSONAL FACTORS  none  are also affecting patient's functional outcome.   REHAB POTENTIAL: Good  CLINICAL DECISION MAKING: Stable/uncomplicated  EVALUATION COMPLEXITY: Low   GOALS: Goals reviewed with patient? Yes  SHORT TERM GOALS: Target date: 03/27/2022   Be independent in initial HEP Baseline:  Goal status: INITIAL  2.  Report > or = to 75% use of Rt UE for functional tasks due to reduced pain Baseline: 90%  (03/19/22) Goal status: MET  3.  Report postural corrections at home and work to improve scapular position at rest Baseline: rounded shoulder posture  Goal status: INITIAL  4.  Report > or = to 30% reduction in Rt shoulder pain with functional tasks  Baseline: 2-8/10 Goal status: INITIAL   LONG TERM GOALS: Target date: 04/24/2022  Be independent in advanced HEP Baseline:  Goal status: MET  2.  Improve FOTO to > or = to 71 Baseline: 54 Goal status: INITIAL  3.  Report > or = to 90% use of Rt UE with functional tasks  Baseline: 90%  (03/19/22) Goal status: MET  4.  Report > or = to 75% reduction in Rt UE pain with use  Baseline: 80% (03/19/22) Goal status: MET  5.  Demonstrate erect seated posture > 75% of the time without cueing and report postural corrections with desk work Baseline: 40% (03/19/22) Goal status: partially met    PLAN: PT FREQUENCY: 1x/week likely biweekly  PT DURATION: 8 weeks  PLANNED INTERVENTIONS: Therapeutic exercises, Therapeutic activity, Neuromuscular re-education, Balance training, Gait training, Patient/Family education, Self Care, Joint mobilization, Dry Needling, Electrical stimulation, Cryotherapy, Moist heat, Taping, Ionotophoresis 43m/ml  Dexamethasone, and Manual therapy  PLAN FOR NEXT SESSION: Hold chart x 2 weeks and D/C if pt doesn't return    KSigurd Sos PT 03/19/22 12:22 PM  PHYSICAL THERAPY DISCHARGE SUMMARY  Visits from Start of Care: 2  Current functional level related to goals / functional outcomes: See above for current status.     Remaining deficits: Pt was doing well with postural corrections and HEP to manage shoulder pain.  Pt will follow-up with MD as needed    Education / Equipment: Posture, HEP   Patient agrees to discharge. Patient goals were partially met. Patient is being discharged due to being pleased with the current functional level.  KSigurd Sos PT 05/02/22 4:33 PM   [facility] [address] [place] [address]Phone # [phone] [phone]

## 2022-03-20 ENCOUNTER — Ambulatory Visit: Payer: No Typology Code available for payment source | Admitting: Sports Medicine

## 2022-06-04 ENCOUNTER — Other Ambulatory Visit: Payer: Self-pay | Admitting: Physician Assistant

## 2022-07-12 ENCOUNTER — Ambulatory Visit: Payer: No Typology Code available for payment source | Admitting: Family Medicine

## 2022-07-12 VITALS — BP 126/86 | HR 100 | Temp 98.1°F | Wt 229.4 lb

## 2022-07-12 DIAGNOSIS — R059 Cough, unspecified: Secondary | ICD-10-CM

## 2022-07-12 DIAGNOSIS — J3489 Other specified disorders of nose and nasal sinuses: Secondary | ICD-10-CM | POA: Diagnosis not present

## 2022-07-12 DIAGNOSIS — J40 Bronchitis, not specified as acute or chronic: Secondary | ICD-10-CM | POA: Diagnosis not present

## 2022-07-12 LAB — POCT INFLUENZA A/B
Influenza A, POC: NEGATIVE
Influenza B, POC: NEGATIVE

## 2022-07-12 LAB — POC COVID19 BINAXNOW: SARS Coronavirus 2 Ag: NEGATIVE

## 2022-07-12 MED ORDER — BENZONATATE 100 MG PO CAPS: 100.0000 mg | ORAL_CAPSULE | Freq: Two times a day (BID) | ORAL | 0 refills | Status: DC | PRN

## 2022-07-12 MED ORDER — ALBUTEROL SULFATE HFA 108 (90 BASE) MCG/ACT IN AERS: 2.0000 | INHALATION_SPRAY | Freq: Four times a day (QID) | RESPIRATORY_TRACT | 0 refills | Status: DC | PRN

## 2022-07-12 NOTE — Patient Instructions (Signed)
A prescription for albuterol inhaler was sent to your pharmacy along with Tessalon to help with cough.  Continue over-the-counter medications and at home remedies to help with symptoms.  For increased/worsened symptoms notify clinic.

## 2022-07-12 NOTE — Progress Notes (Signed)
Subjective:    Patient ID: Lucas Murray, male    DOB: 10-16-1970, 51 y.o.   MRN: [id number]  Chief Complaint  Patient presents with   Cough    Pt reports he had flu two weeks ago. Still has lingering cough. Sx worsen on Monday. Productive cough-dark yellow/green and chest congestion on Monday. Denied chills, fever, bodyache. Not taking anything for cough.     HPI Patient is a 51 yo male with pmh sig for seasonal allergieswas seen today for follow-up on ongoing concern.  Patient with flu 2 weeks ago after his son was dx'd.  Symptoms improved, but cough lingered last wk.  This wk cough productive, chest congestion worse at night,  and has rhinorrhea.  Denies fever, chills, n/v, diarrhea, HAs, sore throat.  Not currently taking any thing for symptoms.  Past Medical History:  Diagnosis Date   Allergy    seasonal allergies   Barrett's esophagus    Chicken pox    Chronic kidney disease    GERD (gastroesophageal reflux disease)    hx of-with certain foods   Unilateral congenital absence of kidney    Urinary tract infection     Allergies  Allergen Reactions   E-Mycin [Erythromycin] Anaphylaxis   Penicillins     Has patient had a PCN reaction causing immediate rash, facial/tongue/throat swelling, SOB or lightheadedness with hypotension: No Has patient had a PCN reaction causing severe rash involving mucus membranes or skin necrosis: No Has patient had a PCN reaction that required hospitalization: No Has patient had a PCN reaction occurring within the last 10 years: No If all of the above answers are "NO", then may proceed with Cephalosporin use.     ROS General: Denies fever, chills, night sweats, changes in weight, changes in appetite HEENT: Denies headaches, ear pain, changes in vision, sore throat +rhinorrhea CV: Denies CP, palpitations, SOB, orthopnea  Pulm: Denies SOB, cough, wheezing  +cough, chest congestion GI: Denies abdominal pain, nausea, vomiting, diarrhea,  constipation GU: Denies dysuria, hematuria, frequency Msk: Denies muscle cramps, joint pains Neuro: Denies weakness, numbness, tingling Skin: Denies rashes, bruising Psych: Denies depression, anxiety, hallucinations     Objective:    Blood pressure 126/86, pulse 100, temperature 98.1 F (36.7 C), temperature source Oral, weight 229 lb 6.4 oz (104.1 kg), SpO2 96 %.   Gen. Pleasant, well-nourished, in no distress, normal affect   HEENT: [place]/AT, face symmetric, conjunctiva clear, no scleral icterus, PERRLA, EOMI, nares patent without drainage, pharynx without erythema or exudate.  TMs full b/l. Lungs: no accessory muscle use, CTAB, no wheezes or rales Cardiovascular: RRR, no m/r/g, no peripheral edema Neuro:  A&Ox3, CN II-XII intact, normal gait Skin:  Warm, no lesions/ rash   Wt Readings from Last 3 Encounters:  07/12/22 229 lb 6.4 oz (104.1 kg)  03/06/22 227 lb (103 kg)  02/20/22 228 lb (103.4 kg)    Lab Results  Component Value Date   WBC 5.6 02/20/2021   HGB 15.9 02/20/2021   HCT 44.5 02/20/2021   PLT 197.0 02/20/2021   GLUCOSE 93 03/06/2022   CHOL 159 02/20/2021   TRIG 126.0 02/20/2021   HDL 31.80 (L) 02/20/2021   LDLCALC 102 (H) 02/20/2021   ALT 34 02/20/2021   AST 22 02/20/2021   NA 137 03/06/2022   K 4.0 03/06/2022   CL 102 03/06/2022   CREATININE 1.20 03/06/2022   BUN 11 03/06/2022   CO2 27 03/06/2022   TSH 1.73 02/20/2021   PSA 0.62 02/20/2021  Assessment/Plan:  Bronchitis  - Plan: albuterol (VENTOLIN HFA) 108 (90 Base) MCG/ACT inhaler, benzonatate (TESSALON) 100 MG capsule  Cough, unspecified type  - Plan: POC COVID-19 BinaxNow, POC Influenza A/B, benzonatate (TESSALON) 100 MG capsule  Nasal drainage -COVID and flu testing negative  - Plan: POC COVID-19 BinaxNow, POC Influenza A/B   Postviral cough with progression into bronchitis.  Discussed supportive care with OTC cough/cold medications, warm fluids, cough lozenges, steam from shower, rest,  etc.  Discussed albuterol inhaler as needed for wheezing/chest tightness.  Tessalon for cough.  Antibiotics not indicated.  Given strict precautions for worsening/new symptoms.  Given handouts.  F/u prn  Grier Mitts, MD

## 2023-02-21 ENCOUNTER — Other Ambulatory Visit: Payer: Self-pay | Admitting: *Deleted

## 2023-02-21 DIAGNOSIS — R109 Unspecified abdominal pain: Secondary | ICD-10-CM

## 2023-03-11 ENCOUNTER — Ambulatory Visit (HOSPITAL_COMMUNITY)
Admission: RE | Admit: 2023-03-11 | Discharge: 2023-03-11 | Disposition: A | Discharge status: Home / Self Care | Payer: No Typology Code available for payment source | Source: Ambulatory Visit | Attending: Surgery | Admitting: Surgery

## 2023-03-11 DIAGNOSIS — R109 Unspecified abdominal pain: Secondary | ICD-10-CM | POA: Diagnosis present

## 2023-03-11 NOTE — Progress Notes (Unsigned)
Patient name: Lucas Murray MRN: [id number] DOB: 06/23/1971 Sex: male  REASON FOR VISIT: 2 year follow-up, splenic artery aneurysm  HPI: Lucas Murray is a 52 y.o. male who presents for 2-year follow-up for surveillance of splenic artery aneurysm.  Last seen on 10/11/2020 when his splenic artery aneurysm measured approximately 2.2 cm.  We recommended follow-up again in 2 years.  No new issues over the last 2 years.  The patient's splenic artery aneurysm was initially identified incidentally after he was hospitalized in November 2018 with a bowel obstruction and he underwent laparotomy with ileocecectomy.  CT showed this incidental finding of a splenic artery aneurysm at that time.  Past Medical History:  Diagnosis Date   Allergy    seasonal allergies   Barrett's esophagus    Chicken pox    Chronic kidney disease    GERD (gastroesophageal reflux disease)    hx of-with certain foods   Unilateral congenital absence of kidney    Urinary tract infection     Past Surgical History:  Procedure Laterality Date   COLON RESECTION N/A 07/01/2017   Procedure: LAPAROSCOPY, LAPAROTOMY WITH ILEO-CECECTOMY;  Surgeon: Glenna Fellows, MD;  Location: [facility];  Service: General;  Laterality: N/A;   FRENULECTOMY, LINGUAL N/A    age 79   VASECTOMY     WISDOM TOOTH EXTRACTION  1990    Family History  Problem Relation Age of Onset   Arthritis Mother    Hyperlipidemia Mother    Hypertension Mother    Diabetes Mother        type 1.5   Hypothyroidism Mother    Arthritis Maternal Grandmother    Heart disease Maternal Grandfather    Cancer Maternal Grandfather        lung   Hypertension Maternal Grandfather    Colon polyps Neg Hx    Colon cancer Neg Hx    Esophageal cancer Neg Hx    Stomach cancer Neg Hx    Rectal cancer Neg Hx     SOCIAL HISTORY: Social History   Tobacco Use   Smoking status: Never   Smokeless tobacco: Never  Substance Use Topics   Alcohol use: Yes    Alcohol/week: 2.0  standard drinks of alcohol    Types: 2 Standard drinks or equivalent per week    Allergies  Allergen Reactions   E-Mycin [Erythromycin] Anaphylaxis   Penicillins     Has patient had a PCN reaction causing immediate rash, facial/tongue/throat swelling, SOB or lightheadedness with hypotension: No Has patient had a PCN reaction causing severe rash involving mucus membranes or skin necrosis: No Has patient had a PCN reaction that required hospitalization: No Has patient had a PCN reaction occurring within the last 10 years: No If all of the above answers are "NO", then may proceed with Cephalosporin use.     Current Outpatient Medications  Medication Sig Dispense Refill   albuterol (VENTOLIN HFA) 108 (90 Base) MCG/ACT inhaler Inhale 2 puffs into the lungs every 6 (six) hours as needed for wheezing or shortness of breath. 8 g 0   benzonatate (TESSALON) 100 MG capsule Take 1 capsule (100 mg total) by mouth 2 (two) times daily as needed for cough. 20 capsule 0   omeprazole (PRILOSEC) 40 MG capsule TAKE 1 CAPSULE BY MOUTH DAILY 90 capsule 3   No current facility-administered medications for this visit.    REVIEW OF SYSTEMS:  [X]  denotes positive finding, [ ]  denotes negative finding Cardiac  Comments:  Chest pain or chest  pressure:    Shortness of breath upon exertion:    Short of breath when lying flat:    Irregular heart rhythm:        Vascular    Pain in calf, thigh, or hip brought on by ambulation:    Pain in feet at night that wakes you up from your sleep:     Blood clot in your veins:    Leg swelling:         Pulmonary    Oxygen at home:    Productive cough:     Wheezing:         Neurologic    Sudden weakness in arms or legs:     Sudden numbness in arms or legs:     Sudden onset of difficulty speaking or slurred speech:    Temporary loss of vision in one eye:     Problems with dizziness:         Gastrointestinal    Blood in stool:     Vomited blood:          Genitourinary    Burning when urinating:     Blood in urine:        Psychiatric    Major depression:         Hematologic    Bleeding problems:    Problems with blood clotting too easily:        Skin    Rashes or ulcers:        Constitutional    Fever or chills:      PHYSICAL EXAM: There were no vitals filed for this visit.  GENERAL: The patient is a well-nourished male, in no acute distress. The vital signs are documented above. CARDIAC: There is a regular rate and rhythm.  VASCULAR:  Bilateral femoral pulses palpable Bilateral DP PT pulses palpable PULMONARY: No respiratory distress. ABDOMEN: Soft and non-tender. MUSCULOSKELETAL: There are no major deformities or cyanosis. NEUROLOGIC: No focal weakness or paresthesias are detected. SKIN: There are no ulcers or rashes noted. PSYCHIATRIC: The patient has a normal affect.  DATA:   ABDOMINAL VISCERAL  Patient Name:  Lucas Murray  Date of Exam:   03/11/2023 Medical Rec #: [id number]   Accession #:    [id number] Date of Birth: [birth year]  Patient Gender: M Patient Age:   51 years Exam Location:  [facility] Procedure:      VAS [place] MESENTERIC Referring Phys: [id number] Lucas Murray   --------------------------------------------------------------------------- -----  Indications: Hx Splenic artery aneurysm  High Risk Factors: No history of smoking.  Other Factors: 07/01/17 Laparotomy with ileo-cecectomy.  Limitations: Air/bowel gas. Comparison Study: 10/05/20 CTA ABD/Pelvis                   09/06/20 Mesenteric Limited  Performing Technologist: Lucas Murray RVT, RDMS    Examination Guidelines: A complete evaluation includes B-mode imaging, spectral Doppler, color Doppler, and power Doppler as needed of all accessible portions of each vessel. Bilateral testing is considered an integral part of a complete examination. Limited examinations for reoccurring indications may be performed as  noted.    Duplex Findings: +----------------------+--------+--------+------+--------+ Mesenteric            PSV cm/sEDV cm/sPlaqueComments +----------------------+--------+--------+------+--------+ Aorta Prox               84                          +----------------------+--------+--------+------+--------+ Celiac  Artery Origin    171                          +----------------------+--------+--------+------+--------+ Celiac Artery Proximal  141                          +----------------------+--------+--------+------+--------+ Splenic                  66                          +----------------------+--------+--------+------+--------+    Mesenteric Technologist observations: Splenic artery aneurysm at the splenic hilum measures 1.70cm x 1.46cm x 2.2cm without evidence of hemodynamically significant stenosis.          Summary: Mesenteric:   Patent distal splenic artery aneurysm. Size is essentially unchanged compared to previous CTAs.   *See table(s) above for measurements and observations.   Diagnosing physician: Lucas Murray    Assessment/Plan:  52 y.o. male who presents for 2-year follow-up for surveillance of splenic artery aneurysm.  Last seen on 10/11/2020 when his splenic artery aneurysm measured approximately 2.2 cm.  We recommended follow-up again in 2 years.  The patient's splenic artery aneurysm was initially identified incidentally after he was hospitalized in November 2018 with a bowel obstruction and he underwent laparotomy with ileocecectomy.  Discussed no interval change in the size of his splenic artery aneurysm measuring approximately 2.2 cm on duplex yesterday.  I discussed there is no indication for surgical intervention.  I discussed current SVS guidelines are generally to repair these at greater than 3 cm (unless male and plans for pregnancy).  I will have him follow-up again in 2 years with mesenteric  duplex.   Lucas Shelling, MD [facility] Office: [phone]

## 2023-03-12 ENCOUNTER — Ambulatory Visit (INDEPENDENT_AMBULATORY_CARE_PROVIDER_SITE_OTHER): Payer: No Typology Code available for payment source | Admitting: Vascular Surgery

## 2023-03-12 ENCOUNTER — Encounter: Payer: Self-pay | Admitting: Vascular Surgery

## 2023-03-12 VITALS — BP 130/83 | HR 83 | Temp 97.3°F | Resp 16 | Ht 72.0 in | Wt 230.0 lb

## 2023-03-12 DIAGNOSIS — I728 Aneurysm of other specified arteries: Secondary | ICD-10-CM

## 2023-06-13 ENCOUNTER — Other Ambulatory Visit: Payer: Self-pay | Admitting: Physician Assistant

## 2023-08-16 ENCOUNTER — Encounter: Payer: Self-pay | Admitting: Nurse Practitioner

## 2023-08-16 ENCOUNTER — Ambulatory Visit (INDEPENDENT_AMBULATORY_CARE_PROVIDER_SITE_OTHER): Payer: No Typology Code available for payment source | Admitting: Nurse Practitioner

## 2023-08-16 VITALS — BP 126/88 | HR 86 | Ht 72.0 in | Wt 238.1 lb

## 2023-08-16 DIAGNOSIS — Z8719 Personal history of other diseases of the digestive system: Secondary | ICD-10-CM | POA: Diagnosis not present

## 2023-08-16 DIAGNOSIS — Z860101 Personal history of adenomatous and serrated colon polyps: Secondary | ICD-10-CM | POA: Diagnosis not present

## 2023-08-16 DIAGNOSIS — K219 Gastro-esophageal reflux disease without esophagitis: Secondary | ICD-10-CM | POA: Diagnosis not present

## 2023-08-16 DIAGNOSIS — K2270 Barrett's esophagus without dysplasia: Secondary | ICD-10-CM | POA: Diagnosis not present

## 2023-08-16 MED ORDER — OMEPRAZOLE 40 MG PO CPDR: DELAYED_RELEASE_CAPSULE | ORAL | 3 refills | Status: DC

## 2023-08-16 NOTE — Patient Instructions (Addendum)
 We have sent the following medications to your pharmacy for you to pick up at your convenience: Omeprazole  40 mg  Oki to take additional OTC Omeprazole  20 mg as needed.  Follow up in 1 year or as needed.  EGD Recall due 11/2024.  Follow up with your primary care physician for annual labs.  Due to recent changes in healthcare laws, you may see the results of your imaging and laboratory studies on MyChart before your provider has had a chance to review them.  We understand that in some cases there may be results that are confusing or concerning to you. Not all laboratory results come back in the same time frame and the provider may be waiting for multiple results in order to interpret others.  Please give us  48 hours in order for your provider to thoroughly review all the results before contacting the office for clarification of your results.   Thank you for trusting me with your gastrointestinal care!   Elida Shawl, CRNP

## 2023-08-16 NOTE — Progress Notes (Signed)
 08/16/2023 Lucas Murray [id number] 11/10/1970   Chief Complaint: Medication refill   History of Present Illness: Lucas Murray is a 53 year old male with a past medical history of congenital absence of kidney, splenic artery aneurysm followed by vascular specialist, GERD, Barrett's esophagus and small bowel obstruction of unclear etiology (possible congenital abnormality) s/p laparoscopic Ileo-cecectomy 2018. He is known by Dr. Leigh.  He presents today for his annual GERD follow-up.  He endorses taking Omeprazole  40 mg daily 10 to 15 minutes before breakfast.  He questions if he can take an second dose of Omeprazole  if he develops breakthrough reflux symptoms.  He sometimes has active reflux symptoms if he has a URI.  If he skips to days of Omeprazole  he gets throat burning with voice changes.  No abdominal pain.  He passes a normal formed brown bowel movement most days.  He takes a fiber supplement daily.  No bloody or black stools.  No NSAID use.  He underwent an EGD 12/04/2021 which showed a 2 cm hiatal hernia and Barrett's esophagus.  He was advised to repeat an EGD in 3 years.  He underwent a colonoscopy 01/20/2021 which identified one 4 mm adenomatous polyp removed from the descending colon and a patent end-to-end ileocolonic anastomosis.  He was advised to repeat a colonoscopy in 7 years.      Latest Ref Rng & Units 02/20/2021    9:02 AM 02/19/2020    7:53 AM 04/16/2018   11:42 AM  CBC  WBC 4.0 - 10.5 K/uL 5.6  5.9  5.6   Hemoglobin 13.0 - 17.0 g/dL 84.0  84.9  84.0   Hematocrit 39.0 - 52.0 % 44.5  41.9  45.5   Platelets 150.0 - 400.0 K/uL 197.0  200.0  213.0        Latest Ref Rng & Units 03/06/2022    9:42 AM 02/20/2021    9:02 AM 02/19/2020    7:53 AM  CMP  Glucose 70 - 99 mg/dL 93  893  897   BUN 6 - 23 mg/dL 11  13  15    Creatinine 0.40 - 1.50 mg/dL 8.79  8.82  8.94   Sodium 135 - 145 mEq/L 137  139  138   Potassium 3.5 - 5.1 mEq/L 4.0  4.2  3.9   Chloride 96 - 112 mEq/L 102   103  103   CO2 19 - 32 mEq/L 27  27  27    Calcium 8.4 - 10.5 mg/dL 89.9  9.8  9.6   Total Protein 6.0 - 8.3 g/dL  7.1  6.6   Total Bilirubin 0.2 - 1.2 mg/dL  0.6  0.7   Alkaline Phos 39 - 117 U/L  52  48   AST 0 - 37 U/L  22  24   ALT 0 - 53 U/L  34  43     PAST GI PROCEDURES:  Colonoscopy 01/20/2021:  - patent end-to-end ileocolonic anastomosis, one 4 mm polyp in the descending colon, significant looping and internal hemorrhoids.  Polyp was adenomatous.   - Repeat recommended in 7 years.   EGD 12/04/21:  2 cm hiatal hernia. - Esophageal mucosal changes classified as Barrett's stage C5-M6 per [place] criteria. Biopsied. - Normal esophagus - no active esophagitis appreciated - Normal stomach. - Normal duodenal bulb and second portion of the duodenum - EGD due 11/2024.     1. Surgical [P], esophageal biopsy 38-36 CARDIA TYPE GASTRIC MUCOSA SHOWING MILD CHRONIC GASTRITIS WITH INTESTINAL  METAPLASIA NEGATIVE FOR SQUAMOUS EPITHELIUM NEGATIVE FOR DYSPLASIA AND CARCINOMA (SEE MICROSCOPIC COMMENT) 2. Surgical [P], esophageal biopsy 36-34 CARDIA TYPE GASTRIC MUCOSA WITH MINIMAL CHRONIC GASTRITIS AND INTESTINAL METAPLASIA SCANT DETACHED FRAGMENT OF REACTIVE SQUAMOUS EPITHELIUM NEGATIVE FOR DYSPLASIA AND CARCINOMA (SEE MICROSCOPIC COMMENT) 3. Surgical [P], esophageal biopsy 34-32 CARDIA TYPE GASTRIC MUCOSA WITH MINIMAL CHRONIC GASTRITIS AND EXTENSIVE INTESTINAL METAPLASIA NEGATIVE FOR DYSPLASIA AND CARCINOMA (SEE MICROSCOPIC COMMENT)   No current outpatient medications on file prior to visit.   No current facility-administered medications on file prior to visit.   Allergies  Allergen Reactions   E-Mycin [Erythromycin] Anaphylaxis   Penicillins     Has patient had a PCN reaction causing immediate rash, facial/tongue/throat swelling, SOB or lightheadedness with hypotension: No Has patient had a PCN reaction causing severe rash involving mucus membranes or skin necrosis: No Has patient  had a PCN reaction that required hospitalization: No Has patient had a PCN reaction occurring within the last 10 years: No If all of the above answers are NO, then may proceed with Cephalosporin use.    Current Medications, Allergies, Past Medical History, Past Surgical History, Family History and Social History were reviewed in [employer] record.  Review of Systems:   Constitutional: Negative for fever, sweats, chills or weight loss.  Respiratory: Negative for shortness of breath.   Cardiovascular: Negative for chest pain, palpitations and leg swelling.  Gastrointestinal: See HPI.  Musculoskeletal: Negative for back pain or muscle aches.  Neurological: Negative for dizziness, headaches or paresthesias.   Physical Exam: BP 126/88   Pulse 86   Ht 6' (1.829 m)   Wt 238 lb 2 oz (108 kg)   BMI 32.30 kg/m   General: 53 year old male in no acute distress. Head: Normocephalic and atraumatic. Eyes: No scleral icterus. Conjunctiva pink . Ears: Normal auditory acuity. Mouth: Dentition intact. No ulcers or lesions.  Lungs: Clear throughout to auscultation. Heart: Regular rate and rhythm, no murmur. Abdomen: Soft, nontender and nondistended. No masses or hepatomegaly. Normal bowel sounds x 4 quadrants.  Rectal: Deferred.  Musculoskeletal: Symmetrical with no gross deformities. Extremities: No edema. Neurological: Alert oriented x 4. No focal deficits.  Psychological: Alert and cooperative. Normal mood and affect  Assessment and Recommendations:  54 year old male with a history of GERD and Barrett's esophagus.  EGD 11/2021 showed evidence of Barrett's esophagus, esophageal biopsy showed cardia type mucosa with minimal chronic gastritis and extensive intestinal metaplasia  (34-32) without dysplasia or carcinoma -Continue Omeprazole  40 mg 1 capsule p.o. daily to be taken 30 to 60 minutes before breakfast -May take Omeprazole  20 mg OTC daily as needed for  breakthrough symptoms -Patient to follow-up with PCP for annual physical to include laboratory studies to check his renal function while on PPI as there is a very low risk of nephritis and the patient has only 1 kidney -Surveillance EGD due 11/2024 -Continue GERD diet  History of colon polyps.  Colonoscopy 01/20/2021 identified one 4 mm adenomatous polyp removed from the descending colon. -Next colonoscopy due 01/2028

## 2023-08-16 NOTE — Progress Notes (Signed)
 Agree with assessment and plan as outlined. He has a long segment of Barrett's and should continue PPI and plan for repeat EGD 3 years from his last exam. Agree with monitoring of renal function.

## 2023-09-06 ENCOUNTER — Ambulatory Visit (INDEPENDENT_AMBULATORY_CARE_PROVIDER_SITE_OTHER): Payer: No Typology Code available for payment source | Admitting: Family Medicine

## 2023-09-06 ENCOUNTER — Encounter: Payer: Self-pay | Admitting: Family Medicine

## 2023-09-06 VITALS — BP 132/90 | HR 80 | Temp 97.7°F | Ht 72.0 in | Wt 238.9 lb

## 2023-09-06 DIAGNOSIS — Z0000 Encounter for general adult medical examination without abnormal findings: Secondary | ICD-10-CM | POA: Diagnosis not present

## 2023-09-06 LAB — HEPATIC FUNCTION PANEL
ALT: 55 U/L — ABNORMAL HIGH (ref 0–53)
AST: 27 U/L (ref 0–37)
Albumin: 4.7 g/dL (ref 3.5–5.2)
Alkaline Phosphatase: 53 U/L (ref 39–117)
Bilirubin, Direct: 0.2 mg/dL (ref 0.0–0.3)
Total Bilirubin: 1 mg/dL (ref 0.2–1.2)
Total Protein: 7 g/dL (ref 6.0–8.3)

## 2023-09-06 LAB — CBC WITH DIFFERENTIAL/PLATELET
Basophils Absolute: 0.1 10*3/uL (ref 0.0–0.1)
Basophils Relative: 0.9 % (ref 0.0–3.0)
Eosinophils Absolute: 0.1 10*3/uL (ref 0.0–0.7)
Eosinophils Relative: 1.7 % (ref 0.0–5.0)
HCT: 46.6 % (ref 39.0–52.0)
Hemoglobin: 16.1 g/dL (ref 13.0–17.0)
Lymphocytes Relative: 29.6 % (ref 12.0–46.0)
Lymphs Abs: 1.8 10*3/uL (ref 0.7–4.0)
MCHC: 34.6 g/dL (ref 30.0–36.0)
MCV: 90.8 fL (ref 78.0–100.0)
Monocytes Absolute: 0.4 10*3/uL (ref 0.1–1.0)
Monocytes Relative: 7 % (ref 3.0–12.0)
Neutro Abs: 3.7 10*3/uL (ref 1.4–7.7)
Neutrophils Relative %: 60.8 % (ref 43.0–77.0)
Platelets: 221 10*3/uL (ref 150.0–400.0)
RBC: 5.13 Mil/uL (ref 4.22–5.81)
RDW: 12.8 % (ref 11.5–15.5)
WBC: 6.1 10*3/uL (ref 4.0–10.5)

## 2023-09-06 LAB — LIPID PANEL
Cholesterol: 168 mg/dL (ref 0–200)
HDL: 27.4 mg/dL — ABNORMAL LOW (ref >39.00)
LDL Cholesterol: 108 mg/dL — ABNORMAL HIGH (ref 0–99)
NonHDL: 140.93
Total CHOL/HDL Ratio: 6
Triglycerides: 166 mg/dL — ABNORMAL HIGH (ref 0.0–149.0)
VLDL: 33.2 mg/dL (ref 0.0–40.0)

## 2023-09-06 LAB — PSA: PSA: 0.72 ng/mL (ref 0.10–4.00)

## 2023-09-06 LAB — BASIC METABOLIC PANEL
BUN: 12 mg/dL (ref 6–23)
CO2: 28 meq/L (ref 19–32)
Calcium: 9.6 mg/dL (ref 8.4–10.5)
Chloride: 102 meq/L (ref 96–112)
Creatinine, Ser: 1.23 mg/dL (ref 0.40–1.50)
GFR: 67.22 mL/min (ref >60.00)
Glucose, Bld: 112 mg/dL — ABNORMAL HIGH (ref 70–99)
Potassium: 4.2 meq/L (ref 3.5–5.1)
Sodium: 137 meq/L (ref 135–145)

## 2023-09-06 LAB — HEMOGLOBIN A1C: Hgb A1c MFr Bld: 5.8 % (ref 4.6–6.5)

## 2023-09-06 NOTE — Progress Notes (Signed)
Established Patient Office Visit  Subjective   Patient ID: Lucas Murray, male    DOB: [birth year]  Age: 53 y.o. MRN: [id number]  Chief Complaint  Patient presents with   Annual Exam    HPI   [name] is seen for physical exam.  Generally healthy.  Does have past history of small bowel obstruction requiring surgery about 6 years ago.  No problems since then.  Congenital single kidney.  He does have history of GERD and is followed by GI and maintained on Prilosec 40 mg daily.  GERD symptoms fairly well-controlled.  He recently saw dermatologist and had 2 skin lesions removed from his back.  These were apparently mild dysplastic nevi.  Health maintenance reviewed:  Health Maintenance  Topic Date Due   Zoster Vaccines- Shingrix (1 of 2) Never done   COVID-19 Vaccine (3 - Pfizer risk series) 12/18/2019   DTaP/Tdap/Td (3 - Td or Tdap) 01/03/2025   Colonoscopy  01/21/2028   INFLUENZA VACCINE  Completed   Hepatitis C Screening  Completed   HIV Screening  Completed   HPV VACCINES  Aged Out   Family history-mother with passed away this past year complications of pneumonia.  She apparently had latent onset type 1 diabetes of adulthood.  She also had history of hypertension, hyperlipidemia, and hypothyroidism.  He was separated from his father around age 51 and has not kept in touch with him.  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  He is married and has a son and daughter.  Never smoked.  Infrequent alcohol usually 1 or 2 drinks per week. Currently not exercising.  Did recently acquire treadmill  Past Medical History:  Diagnosis Date   Allergy    seasonal allergies   Barrett's esophagus    Chicken pox    Chronic kidney disease    GERD (gastroesophageal reflux disease)    hx of-with certain foods   Unilateral congenital absence of kidney    Urinary tract infection    Past Surgical History:  Procedure Laterality Date   COLON RESECTION N/A 07/01/2017    Procedure: LAPAROSCOPY, LAPAROTOMY WITH ILEO-CECECTOMY;  Surgeon: Lucas Fellows, MD;  Location: [facility];  Service: General;  Laterality: N/A;   FRENULECTOMY, LINGUAL N/A    age 67   VASECTOMY     WISDOM TOOTH EXTRACTION  1990    reports that he has never smoked. He has never used smokeless tobacco. He reports current alcohol use of about 2.0 standard drinks of alcohol per week. He reports that he does not use drugs. family history includes Arthritis in his maternal grandmother and mother; Cancer in his maternal grandfather; Diabetes in his mother; Heart disease in his maternal grandfather; Hyperlipidemia in his mother; Hypertension in his maternal grandfather and mother; Hypothyroidism in his mother. Allergies  Allergen Reactions   E-Mycin [Erythromycin] Anaphylaxis   Penicillins     Has patient had a PCN reaction causing immediate rash, facial/tongue/throat swelling, SOB or lightheadedness with hypotension: No Has patient had a PCN reaction causing severe rash involving mucus membranes or skin necrosis: No Has patient had a PCN reaction that required hospitalization: No Has patient had a PCN reaction occurring within the last 10 years: No If all of the above answers are "NO", then may proceed with Cephalosporin use.      Review of Systems  Constitutional:  Negative for chills, fever, malaise/fatigue and weight loss.  HENT:  Negative for hearing loss.   Eyes:  Negative for blurred vision  and double vision.  Respiratory:  Negative for cough and shortness of breath.   Cardiovascular:  Negative for chest pain, palpitations and leg swelling.  Gastrointestinal:  Negative for abdominal pain, blood in stool, constipation and diarrhea.  Genitourinary:  Negative for dysuria.  Skin:  Negative for rash.  Neurological:  Negative for dizziness, speech change, seizures, loss of consciousness and headaches.  Psychiatric/Behavioral:  Negative for depression.       Objective:     BP (!)  132/90 (BP Location: Left Arm, Cuff Size: Normal)   Pulse 80   Temp 97.7 F (36.5 C) (Oral)   Ht 6' (1.829 m)   Wt 238 lb 14.4 oz (108.4 kg)   SpO2 98%   BMI 32.40 kg/m  BP Readings from Last 3 Encounters:  09/06/23 (!) 132/90  08/16/23 126/88  03/12/23 130/83   Wt Readings from Last 3 Encounters:  09/06/23 238 lb 14.4 oz (108.4 kg)  08/16/23 238 lb 2 oz (108 kg)  03/12/23 230 lb (104.3 kg)      Physical Exam Vitals reviewed.  Constitutional:      General: He is not in acute distress.    Appearance: He is well-developed. He is not ill-appearing.  HENT:     Head: Normocephalic and atraumatic.     Right Ear: External ear normal.     Left Ear: External ear normal.  Eyes:     Conjunctiva/sclera: Conjunctivae normal.     Pupils: Pupils are equal, round, and reactive to light.  Neck:     Thyroid: No thyromegaly.  Cardiovascular:     Rate and Rhythm: Normal rate and regular rhythm.     Heart sounds: Normal heart sounds. No murmur heard. Pulmonary:     Effort: No respiratory distress.     Breath sounds: No wheezing or rales.  Abdominal:     General: Bowel sounds are normal. There is no distension.     Palpations: Abdomen is soft. There is no mass.     Tenderness: There is no abdominal tenderness. There is no guarding or rebound.  Musculoskeletal:     Cervical back: Normal range of motion and neck supple.     Right lower leg: No edema.     Left lower leg: No edema.  Lymphadenopathy:     Cervical: No cervical adenopathy.  Skin:    Findings: No rash.  Neurological:     Mental Status: He is alert and oriented to person, place, and time.     Cranial Nerves: No cranial nerve deficit.      No results found for any visits on 09/06/23.    The 10-year ASCVD risk score (Arnett DK, et al., 2019) is: 5.6%    Assessment & Plan:   Problem List Items Addressed This Visit   None Visit Diagnoses       Physical exam    -  Primary   Relevant Orders   Basic metabolic  panel   Lipid panel   CBC with Differential/Platelet   Hepatic function panel   PSA   Hemoglobin A22c     53 year old male here for complete physical.  Blood pressure initially up today and did improve some with repeat at 132/90.  -Recommend he try to lose some weight.  Try to establish more consistent exercise with a minimum 150 minutes/week of moderate intensity exercise -Try to reduce overall snacking and eating out. -We discussed the fact that even modest weight loss of 4% can result in substantial blood pressure  improvement -Check labs as above.  Include A1c with prior history of mildly elevated fasting glucose in the prediabetes range. -Colonoscopy up-to-date -Did discuss Shingrix vaccine and he will consider.  He declines today. -Flu vaccine already given -Set up 42-month follow-up to reassess weight and blood pressure.  Return in about 4 months (around 01/04/2024).    Evelena Peat, MD

## 2024-08-22 ENCOUNTER — Other Ambulatory Visit: Payer: Self-pay | Admitting: Nurse Practitioner

## 2024-09-14 ENCOUNTER — Encounter: Admitting: Family Medicine

## 2024-09-16 ENCOUNTER — Encounter: Payer: Self-pay | Admitting: Family Medicine

## 2024-09-16 ENCOUNTER — Ambulatory Visit: Admitting: Family Medicine

## 2024-09-16 VITALS — BP 132/90 | HR 76 | Temp 97.6°F | Ht 72.84 in | Wt 234.0 lb

## 2024-09-16 DIAGNOSIS — Z0001 Encounter for general adult medical examination with abnormal findings: Secondary | ICD-10-CM

## 2024-09-16 DIAGNOSIS — Z23 Encounter for immunization: Secondary | ICD-10-CM

## 2024-09-16 DIAGNOSIS — Z0000 Encounter for general adult medical examination without abnormal findings: Secondary | ICD-10-CM

## 2024-09-16 DIAGNOSIS — R6882 Decreased libido: Secondary | ICD-10-CM

## 2024-09-16 MED ORDER — TADALAFIL 20 MG PO TABS: 10.0000 mg | ORAL_TABLET | ORAL | 11 refills | Status: AC | PRN

## 2024-09-16 NOTE — Progress Notes (Signed)
 "  Established Patient Office Visit  Subjective   Patient ID: Lucas Murray, male    DOB: [birth year]  Age: 54 y.o. MRN: [id number]  Chief Complaint  Patient presents with   Annual Exam    HPI    Lucas Murray is seen for physical exam.  Generally doing well.  He has history of GERD and takes omeprazole  for that.  No other regular medications.  Past history of splenic artery aneurysm and small bowel obstruction.  Congenital single kidney.  Bought a treadmill last year but not exercising regularly.  Infrequently uses treadmill currently.  His major complaint is decreased libido and also has had some erectile dysfunction.  He states he and his wife had some marital issues but have gotten some help and are trying to work through things.  Lucas Murray has had previous low testosterone  but we had gotten a second value which was low limit of normal  Health maintenance reviewed:  Health Maintenance  Topic Date Due   Hepatitis B Vaccines 19-59 Average Risk (1 of 3 - 19+ 3-dose series) Never done   Zoster Vaccines- Shingrix (1 of 2) Never done   COVID-19 Vaccine (3 - Pfizer risk series) 12/18/2019   Influenza Vaccine  03/13/2024   Colonoscopy  01/21/2028   DTaP/Tdap/Td (4 - Td or Tdap) 09/16/2034   Pneumococcal Vaccine: 50+ Years  Completed   HPV VACCINES (No Doses Required) Completed   Hepatitis C Screening  Completed   HIV Screening  Completed   Meningococcal B Vaccine  Aged Out   - Needs tetanus booster and also no history of pneumonia vaccine. -No history of Shingrix vaccine and he will consider getting this later  Social history-married.  Has 83 year old son and 34 year old daughter.  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  Infrequent alcohol use.  Never smoked.  Family history-mother died a little over a year ago complications of pneumonia.  She had multiple other medical problems including history of hypertension, hyperlipidemia, hypothyroidism, and latent onset type 1 diabetes  of adulthood.  Biologic father's history unknown.  No full blooded siblings  Past Medical History:  Diagnosis Date   Allergy    seasonal allergies   Barrett's esophagus    Chicken pox    Chronic kidney disease    GERD (gastroesophageal reflux disease)    hx of-with certain foods   Unilateral congenital absence of kidney    Urinary tract infection    Past Surgical History:  Procedure Laterality Date   COLON RESECTION N/A 07/01/2017   Procedure: LAPAROSCOPY, LAPAROTOMY WITH ILEO-CECECTOMY;  Surgeon: Mikell Katz, MD;  Location: [facility];  Service: General;  Laterality: N/A;   FRENULECTOMY, LINGUAL N/A    age 50   VASECTOMY     WISDOM TOOTH EXTRACTION  1990    reports that he has never smoked. He has never used smokeless tobacco. He reports current alcohol use of about 2.0 standard drinks of alcohol per week. He reports that he does not use drugs. family history includes Arthritis in his maternal grandmother and mother; Cancer in his maternal grandfather; Diabetes in his mother; Heart disease in his maternal grandfather; Hyperlipidemia in his mother; Hypertension in his maternal grandfather and mother; Hypothyroidism in his mother. Allergies[1]   Review of Systems  Constitutional:  Negative for malaise/fatigue and weight loss.  Eyes:  Negative for blurred vision.  Respiratory:  Negative for shortness of breath.   Cardiovascular:  Negative for chest pain.  Gastrointestinal:  Negative for abdominal pain.  Genitourinary:  Negative for dysuria.  Neurological:  Negative for dizziness, weakness and headaches.      Objective:     BP (!) 132/90 (BP Location: Left Arm, Cuff Size: Normal)   Pulse 76   Temp 97.6 F (36.4 C) (Oral)   Ht 6' 0.84 (1.85 m)   Wt 234 lb (106.1 kg)   SpO2 94%   BMI 31.01 kg/m  BP Readings from Last 3 Encounters:  09/16/24 (!) 132/90  09/06/23 (!) 132/90  08/16/23 126/88   Wt Readings from Last 3 Encounters:  09/16/24 234 lb (106.1 kg)   09/06/23 238 lb 14.4 oz (108.4 kg)  08/16/23 238 lb 2 oz (108 kg)      Physical Exam Vitals reviewed.  Constitutional:      General: He is not in acute distress.    Appearance: He is well-developed. He is not ill-appearing.  HENT:     Head: Normocephalic and atraumatic.     Right Ear: External ear normal.     Left Ear: External ear normal.  Eyes:     Conjunctiva/sclera: Conjunctivae normal.     Pupils: Pupils are equal, round, and reactive to light.  Neck:     Thyroid : No thyromegaly.  Cardiovascular:     Rate and Rhythm: Normal rate and regular rhythm.     Heart sounds: Normal heart sounds. No murmur heard. Pulmonary:     Effort: No respiratory distress.     Breath sounds: No wheezing or rales.  Abdominal:     General: Bowel sounds are normal. There is no distension.     Palpations: Abdomen is soft. There is no mass.     Tenderness: There is no abdominal tenderness. There is no guarding or rebound.  Musculoskeletal:     Cervical back: Normal range of motion and neck supple.     Right lower leg: No edema.     Left lower leg: No edema.  Lymphadenopathy:     Cervical: No cervical adenopathy.  Skin:    Findings: No rash.  Neurological:     Mental Status: He is alert and oriented to person, place, and time.     Cranial Nerves: No cranial nerve deficit.      No results found for any visits on 09/16/24.    The 10-year ASCVD risk score (Arnett DK, et al., 2019) is: 7.5%    Assessment & Plan:   Problem List Items Addressed This Visit   None Visit Diagnoses       Physical exam    -  Primary   Relevant Orders   Lipid panel   CBC with Differential/Platelet   CMP   Hemoglobin A1c   PSA     Low libido       Relevant Orders   Testosterone      Need for pneumococcal vaccination       Relevant Orders   Pneumococcal conjugate vaccine 20-valent (Prevnar 20) (Completed)     Need for tetanus booster       Relevant Orders   Tdap vaccine greater than or equal to 7yo  IM (Completed)     54 year old male here for physical exam.  He has elevated blood pressure today without diagnosis of hypertension.  This did come down some after rest to 132/90.  He has some issues with low libido and erectile dysfunction.  History of borderline low testosterone  in the past.  We discussed several health maintenance issues as follows  -Try establish more consistent exercise with minimum 150  minutes/week of moderate intensity exercise such as brisk walking - Obtain future labs including CBC, CMP, lipid, A1c, PSA, and testosterone  - Wrote for Cialis  to try 20 mg every other day as needed for erectile dysfunction - Recommend Tdap and Prevnar 20 and patient consents to both of these - Consider Shingrix vaccine at some point later this year - Colonoscopy repeat due 2029 - Monitor blood pressure closely and be in touch if consistently greater than 140 systolic or 90 diastolic.  Handout on DASH diet given.  No follow-ups on file.    Wolm Scarlet, MD     [1]  Allergies Allergen Reactions   E-Mycin [Erythromycin] Anaphylaxis   Penicillins     Has patient had a PCN reaction causing immediate rash, facial/tongue/throat swelling, SOB or lightheadedness with hypotension: No Has patient had a PCN reaction causing severe rash involving mucus membranes or skin necrosis: No Has patient had a PCN reaction that required hospitalization: No Has patient had a PCN reaction occurring within the last 10 years: No If all of the above answers are NO, then may proceed with Cephalosporin use.    "

## 2024-09-16 NOTE — Patient Instructions (Signed)
 Consider monitoring blood pressure over the year periodically and be in touch if consistently > 140/90.

## 2024-09-18 ENCOUNTER — Ambulatory Visit: Payer: Self-pay | Admitting: Family Medicine

## 2024-09-18 ENCOUNTER — Other Ambulatory Visit

## 2024-09-18 DIAGNOSIS — R6882 Decreased libido: Secondary | ICD-10-CM

## 2024-09-18 DIAGNOSIS — Z0000 Encounter for general adult medical examination without abnormal findings: Secondary | ICD-10-CM

## 2024-09-18 LAB — CBC WITH DIFFERENTIAL/PLATELET
Basophils Absolute: 0 10*3/uL (ref 0.0–0.1)
Basophils Relative: 0.7 % (ref 0.0–3.0)
Eosinophils Absolute: 0.1 10*3/uL (ref 0.0–0.7)
Eosinophils Relative: 1.4 % (ref 0.0–5.0)
HCT: 45.3 % (ref 39.0–52.0)
Hemoglobin: 15.8 g/dL (ref 13.0–17.0)
Lymphocytes Relative: 22.9 % (ref 12.0–46.0)
Lymphs Abs: 1.5 10*3/uL (ref 0.7–4.0)
MCHC: 34.9 g/dL (ref 30.0–36.0)
MCV: 88.9 fl (ref 78.0–100.0)
Monocytes Absolute: 0.5 10*3/uL (ref 0.1–1.0)
Monocytes Relative: 7.5 % (ref 3.0–12.0)
Neutro Abs: 4.5 10*3/uL (ref 1.4–7.7)
Neutrophils Relative %: 67.5 % (ref 43.0–77.0)
Platelets: 217 10*3/uL (ref 150.0–400.0)
RBC: 5.1 Mil/uL (ref 4.22–5.81)
RDW: 12.9 % (ref 11.5–15.5)
WBC: 6.7 10*3/uL (ref 4.0–10.5)

## 2024-09-18 LAB — COMPREHENSIVE METABOLIC PANEL WITH GFR
ALT: 51 U/L (ref 3–53)
AST: 29 U/L (ref 5–37)
Albumin: 4.7 g/dL (ref 3.5–5.2)
Alkaline Phosphatase: 54 U/L (ref 39–117)
BUN: 12 mg/dL (ref 6–23)
CO2: 28 meq/L (ref 19–32)
Calcium: 9.9 mg/dL (ref 8.4–10.5)
Chloride: 101 meq/L (ref 96–112)
Creatinine, Ser: 1.19 mg/dL (ref 0.40–1.50)
GFR: 69.44 mL/min
Glucose, Bld: 111 mg/dL — ABNORMAL HIGH (ref 70–99)
Potassium: 4.3 meq/L (ref 3.5–5.1)
Sodium: 138 meq/L (ref 135–145)
Total Bilirubin: 0.9 mg/dL (ref 0.2–1.2)
Total Protein: 7.1 g/dL (ref 6.0–8.3)

## 2024-09-18 LAB — LIPID PANEL
Cholesterol: 165 mg/dL (ref 28–200)
HDL: 28.6 mg/dL — ABNORMAL LOW
LDL Cholesterol: 105 mg/dL — ABNORMAL HIGH (ref 10–99)
NonHDL: 136.46
Total CHOL/HDL Ratio: 6
Triglycerides: 159 mg/dL — ABNORMAL HIGH (ref 10.0–149.0)
VLDL: 31.8 mg/dL (ref 0.0–40.0)

## 2024-09-18 LAB — PSA: PSA: 0.71 ng/mL (ref 0.10–4.00)

## 2024-09-18 LAB — TESTOSTERONE: Testosterone: 300.57 ng/dL (ref 300.00–890.00)

## 2024-09-18 LAB — HEMOGLOBIN A1C: Hgb A1c MFr Bld: 5.8 % (ref 4.6–6.5)
